# Patient Record
Sex: Female | Born: 1975 | Race: White | Hispanic: No | Marital: Married | Smoking: Current some day smoker
Health system: Southern US, Community
[De-identification: ages and names within clinical notes are randomized; demographics above are authoritative.]

## PROBLEM LIST (undated history)

## (undated) DIAGNOSIS — F32A Depression, unspecified: Secondary | ICD-10-CM

## (undated) DIAGNOSIS — M5430 Sciatica, unspecified side: Secondary | ICD-10-CM

## (undated) DIAGNOSIS — F329 Major depressive disorder, single episode, unspecified: Secondary | ICD-10-CM

## (undated) HISTORY — DX: Sciatica, unspecified side: M54.30

## (undated) HISTORY — DX: Depression, unspecified: F32.A

## (undated) HISTORY — PX: OTHER SURGICAL HISTORY: SHX169

---
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
## 2006-12-22 HISTORY — PX: LAPAROSCOPY: SHX197

## 2007-03-17 ENCOUNTER — Ambulatory Visit (HOSPITAL_COMMUNITY): Admission: RE | Admit: 2007-03-17 | Discharge: 2007-03-17 | Payer: Self-pay | Admitting: Obstetrics and Gynecology

## 2007-06-14 ENCOUNTER — Emergency Department: Payer: Self-pay | Admitting: Emergency Medicine

## 2007-07-02 ENCOUNTER — Emergency Department: Payer: Self-pay | Admitting: Emergency Medicine

## 2007-09-20 ENCOUNTER — Ambulatory Visit: Payer: Self-pay | Admitting: Pain Medicine

## 2007-09-27 ENCOUNTER — Encounter: Payer: Self-pay | Admitting: Unknown Physician Specialty

## 2007-09-28 ENCOUNTER — Ambulatory Visit: Payer: Self-pay | Admitting: Pain Medicine

## 2007-10-13 ENCOUNTER — Ambulatory Visit: Payer: Self-pay | Admitting: Pain Medicine

## 2007-10-23 ENCOUNTER — Encounter: Payer: Self-pay | Admitting: Unknown Physician Specialty

## 2007-11-22 ENCOUNTER — Encounter: Payer: Self-pay | Admitting: Unknown Physician Specialty

## 2008-06-05 ENCOUNTER — Ambulatory Visit: Payer: Self-pay | Admitting: Pain Medicine

## 2008-06-08 ENCOUNTER — Ambulatory Visit: Payer: Self-pay | Admitting: Pain Medicine

## 2008-06-28 ENCOUNTER — Ambulatory Visit: Payer: Self-pay | Admitting: Physician Assistant

## 2009-05-30 ENCOUNTER — Inpatient Hospital Stay: Payer: Self-pay | Admitting: Internal Medicine

## 2009-07-18 ENCOUNTER — Emergency Department: Payer: Self-pay | Admitting: Emergency Medicine

## 2009-11-09 ENCOUNTER — Emergency Department: Payer: Self-pay | Admitting: Emergency Medicine

## 2009-11-14 ENCOUNTER — Ambulatory Visit: Payer: Self-pay | Admitting: Gastroenterology

## 2011-12-08 ENCOUNTER — Ambulatory Visit (INDEPENDENT_AMBULATORY_CARE_PROVIDER_SITE_OTHER): Payer: BC Managed Care – PPO

## 2011-12-08 DIAGNOSIS — J209 Acute bronchitis, unspecified: Secondary | ICD-10-CM

## 2011-12-08 DIAGNOSIS — IMO0001 Reserved for inherently not codable concepts without codable children: Secondary | ICD-10-CM

## 2011-12-08 DIAGNOSIS — J9801 Acute bronchospasm: Secondary | ICD-10-CM

## 2012-01-20 ENCOUNTER — Ambulatory Visit (INDEPENDENT_AMBULATORY_CARE_PROVIDER_SITE_OTHER): Payer: BC Managed Care – PPO | Admitting: Family Medicine

## 2012-01-20 ENCOUNTER — Other Ambulatory Visit: Payer: Self-pay | Admitting: Family Medicine

## 2012-01-20 ENCOUNTER — Ambulatory Visit: Payer: BC Managed Care – PPO | Admitting: Physician Assistant

## 2012-01-20 DIAGNOSIS — R102 Pelvic and perineal pain: Secondary | ICD-10-CM

## 2012-01-20 DIAGNOSIS — R109 Unspecified abdominal pain: Secondary | ICD-10-CM

## 2012-01-20 DIAGNOSIS — Z113 Encounter for screening for infections with a predominantly sexual mode of transmission: Secondary | ICD-10-CM

## 2012-01-20 LAB — POCT CBC
Granulocyte percent: 66.7 %G (ref 37–80)
HCT, POC: 45.3 % (ref 37.7–47.9)
Hemoglobin: 14.3 g/dL (ref 12.2–16.2)
Lymph, poc: 4.2 — AB (ref 0.6–3.4)
MCH, POC: 32 pg — AB (ref 27–31.2)
MCHC: 31.6 g/dL — AB (ref 31.8–35.4)
MCV: 101.3 fL — AB (ref 80–97)
MID (cbc): 0.5 (ref 0–0.9)
MPV: 12.2 fL (ref 0–99.8)
POC Granulocyte: 9.3 — AB (ref 2–6.9)
POC LYMPH PERCENT: 29.7 % (ref 10–50)
POC MID %: 3.6 % (ref 0–12)
Platelet Count, POC: 187 10*3/uL (ref 142–424)
RBC: 4.47 M/uL (ref 4.04–5.48)
RDW, POC: 13.7 %
WBC: 14 10*3/uL — AB (ref 4.6–10.2)

## 2012-01-20 LAB — POCT UA - MICROSCOPIC ONLY
Casts, Ur, LPF, POC: NEGATIVE
Crystals, Ur, HPF, POC: NEGATIVE
Mucus, UA: NEGATIVE
WBC, Ur, HPF, POC: NEGATIVE
Yeast, UA: NEGATIVE

## 2012-01-20 LAB — POCT URINALYSIS DIPSTICK
Bilirubin, UA: NEGATIVE
Glucose, UA: NEGATIVE
Ketones, UA: NEGATIVE
Leukocytes, UA: NEGATIVE
Nitrite, UA: NEGATIVE
Protein, UA: NEGATIVE
Spec Grav, UA: 1.01
Urobilinogen, UA: 0.2
pH, UA: 6

## 2012-01-20 LAB — POCT URINE PREGNANCY: Preg Test, Ur: NEGATIVE

## 2012-01-20 MED ORDER — LEVONORGEST-ETH ESTRAD 91-DAY 0.1-0.02 & 0.01 MG PO TABS: 1.0000 | ORAL_TABLET | Freq: Every day | ORAL | Status: DC

## 2012-01-20 MED ORDER — TRAMADOL HCL 50 MG PO TABS: 50.0000 mg | ORAL_TABLET | Freq: Three times a day (TID) | ORAL | Status: DC | PRN

## 2012-01-20 MED ORDER — CIPROFLOXACIN HCL 500 MG PO TABS: 500.0000 mg | ORAL_TABLET | Freq: Two times a day (BID) | ORAL | Status: AC

## 2012-01-20 NOTE — Patient Instructions (Signed)

## 2012-01-20 NOTE — Progress Notes (Addendum)
Subjective:    Patient ID: Jessica Gordon, female    DOB: 1976-10-31, 36 y.o.   MRN: [id number]  Abdominal Pain This is a chronic problem. The current episode started more than 1 year ago. The onset quality is undetermined. The problem occurs constantly. The problem has been gradually worsening. The pain is located in the suprapubic region. The pain is severe. The quality of the pain is cramping and sharp. The abdominal pain does not radiate. Associated symptoms include flatus. Pertinent negatives include no anorexia, arthralgias, belching, constipation, diarrhea, dysuria, fever, frequency, headaches, hematochezia, hematuria, melena, myalgias, nausea, vomiting or weight loss. Exacerbated by: stress induced, worsens with stress. Relieved by: heating pad. She has tried acetaminophen for the symptoms. The treatment provided mild relief. There is no history of abdominal surgery, colon cancer, Crohn's disease, gallstones, GERD, irritable bowel syndrome, pancreatitis, PUD or ulcerative colitis.   LMP 3 weeks ago. No prior abnormal paps, last pap was > 1 year ago. no  Hx of STD, sexually active. [id number]. Stress related abd pain. Intensifies with stress. But no constipation or diarrhea. No history of narcotic abuse.    Review of Systems  Constitutional: Negative.  Negative for fever and weight loss.  Eyes: Negative for photophobia.  Respiratory: Negative for shortness of breath and wheezing.   Cardiovascular: Negative for chest pain and palpitations.  Gastrointestinal: Positive for abdominal pain and flatus. Negative for nausea, vomiting, diarrhea, constipation, melena, hematochezia, abdominal distention and anorexia.  Genitourinary: Positive for pelvic pain. Negative for dysuria, urgency, frequency, hematuria, flank pain, decreased urine volume, vaginal bleeding, vaginal discharge, difficulty urinating, vaginal pain, menstrual problem and dyspareunia.  Musculoskeletal: Negative for myalgias and  arthralgias.  Neurological: Negative for dizziness, weakness, numbness and headaches.  Hematological: Does not bruise/bleed easily.  Psychiatric/Behavioral: Negative.        Objective:   Physical Exam  Constitutional: She is oriented to person, place, and time. She appears well-developed and well-nourished.  HENT:  Head: Normocephalic.  Eyes: Pupils are equal, round, and reactive to light.  Neck: Normal range of motion. Neck supple.  Cardiovascular: Normal rate, regular rhythm and normal heart sounds.   Pulmonary/Chest: Effort normal and breath sounds normal.  Abdominal: Soft. Bowel sounds are normal. She exhibits no mass. There is tenderness. There is no rebound and no guarding.    Genitourinary: Vagina normal and uterus normal. No vaginal discharge found.  Musculoskeletal: Normal range of motion.  Neurological: She is alert and oriented to person, place, and time.  Skin: Skin is warm and dry.  Psychiatric: She has a normal mood and affect.  Gu exam: nabothian cysts x 2 at 6 pm but otherwise normal exam, no CMT, vag dc        Assessment & Plan:  1. (Chronic) Bilateral lower abdominal pain x 2 years-Check CBC, CMP, UA with micro, Abd Xray. Will rule out pathological etiology. CBC had a WBC of 14, although UA was negative will persumptively treat with Cipro 500 mg BID x 7 Days. STD testing pending. Other possible causes are appendicitis, ruptured ovarian cyst less likely diverticular dz, less likely UTI due to neg UA. May need GI referral vs Ob/Gyn referral pending [place] results. 2. STD Screening-HIV, RPR, HSV, G&C 3. Suprapubic/pelvic pain-Pap, Refer to get abdominal and pelvic ultrasound 4. Burth control-LoSeasonique ( pt has had it before), patient advised to stop smoking since OCP can cause increase risk of DVT/PE. Pt understands. Also advise her to not have sex until get results of labs and also  finish ABx, if she is going to have sex she needs to use a condom.   [facility] reading  (PRIMARY) by  Dr. Conley Rolls, diff gas patterns, normal.

## 2012-01-21 LAB — COMPREHENSIVE METABOLIC PANEL
ALT: 18 U/L (ref 0–35)
Albumin: 5 g/dL (ref 3.5–5.2)
Alkaline Phosphatase: 57 U/L (ref 39–117)
Glucose, Bld: 89 mg/dL (ref 70–99)
Potassium: 3.9 mEq/L (ref 3.5–5.3)
Sodium: 136 mEq/L (ref 135–145)
Total Bilirubin: 0.8 mg/dL (ref 0.3–1.2)
Total Protein: 7.7 g/dL (ref 6.0–8.3)

## 2012-01-21 LAB — COMPREHENSIVE METABOLIC PANEL WITH GFR
AST: 20 U/L (ref 0–37)
BUN: 12 mg/dL (ref 6–23)
CO2: 25 meq/L (ref 19–32)
Calcium: 9.5 mg/dL (ref 8.4–10.5)
Chloride: 102 meq/L (ref 96–112)
Creat: 0.72 mg/dL (ref 0.50–1.10)

## 2012-01-22 LAB — GC/CHLAMYDIA PROBE AMP, URINE
Chlamydia, Swab/Urine, PCR: NEGATIVE
GC Probe Amp, Urine: NEGATIVE

## 2012-01-22 LAB — HSV(HERPES SIMPLEX VRS) I + II AB-IGG
HSV 1 Glycoprotein G Ab, IgG: <0.1 IV
HSV 2 Glycoprotein G Ab, IgG: 7.75 IV — ABNORMAL HIGH

## 2012-01-22 LAB — RPR

## 2012-01-22 LAB — HIV ANTIBODY (ROUTINE TESTING W REFLEX): HIV: NONREACTIVE

## 2012-01-23 LAB — PAP IG, CT-NG, RFX HPV ASCU
Chlamydia Probe Amp: NEGATIVE
GC Probe Amp: NEGATIVE

## 2012-01-26 ENCOUNTER — Telehealth: Payer: Self-pay | Admitting: Family Medicine

## 2012-01-26 NOTE — Telephone Encounter (Signed)
Spoke with pt regarding labs. Told her to f/u with [place] and schedule for appt after [place]. If she does not hear from [place] then she needs to calll by MOnday. Still having abd pain after cipro.

## 2012-01-27 ENCOUNTER — Other Ambulatory Visit: Payer: Self-pay | Admitting: Family Medicine

## 2012-01-27 NOTE — Telephone Encounter (Signed)
Pt states she was on the phone with someone (can't remember who) about medication questions and a possible ultrasound referral. Pt's phone disconnected during that call. Pts phone also disconnected during our call.   Please call pt bf

## 2012-01-28 ENCOUNTER — Other Ambulatory Visit: Payer: Self-pay | Admitting: Family Medicine

## 2012-01-28 NOTE — Progress Notes (Signed)
Addended by: Lenell Antu on: 01/28/2012 10:55 PM   Modules accepted: Orders

## 2012-01-30 NOTE — Progress Notes (Signed)
Addended by: Lenell Antu on: 01/30/2012 09:22 AM   Modules accepted: Orders

## 2012-01-30 NOTE — Progress Notes (Signed)
Addended by: Lenell Antu on: 01/30/2012 09:38 AM   Modules accepted: Orders

## 2012-02-06 ENCOUNTER — Ambulatory Visit
Admission: RE | Admit: 2012-02-06 | Discharge: 2012-02-06 | Disposition: A | Discharge status: Home / Self Care | Payer: BC Managed Care – PPO | Source: Ambulatory Visit | Attending: Family Medicine | Admitting: Family Medicine

## 2012-02-06 ENCOUNTER — Telehealth: Payer: Self-pay

## 2012-02-06 DIAGNOSIS — R102 Pelvic and perineal pain: Secondary | ICD-10-CM

## 2012-02-06 DIAGNOSIS — Z113 Encounter for screening for infections with a predominantly sexual mode of transmission: Secondary | ICD-10-CM

## 2012-02-06 NOTE — Telephone Encounter (Signed)
Patient states that  The Tramadol that she was prescribed is not helping with the pain she is having pt would like to have something else called in. [facility] [address]

## 2012-02-07 ENCOUNTER — Other Ambulatory Visit: Payer: Self-pay | Admitting: Family Medicine

## 2012-02-07 ENCOUNTER — Telehealth: Payer: Self-pay | Admitting: Family Medicine

## 2012-02-07 DIAGNOSIS — B009 Herpesviral infection, unspecified: Secondary | ICD-10-CM

## 2012-02-07 DIAGNOSIS — G8929 Other chronic pain: Secondary | ICD-10-CM

## 2012-02-07 MED ORDER — TRAMADOL HCL 50 MG PO TABS: 50.0000 mg | ORAL_TABLET | Freq: Four times a day (QID) | ORAL | Status: DC | PRN

## 2012-02-07 MED ORDER — VALACYCLOVIR HCL 1 G PO TABS: ORAL_TABLET | ORAL | Status: DC

## 2012-02-07 NOTE — Telephone Encounter (Signed)
PTS BOYFRIEND JOEY CALLED FOR PT - STATES HAD U/S RECENTLY AND PT IS STILL IN 'EXCRUCIATING PAIN' - STATES PAIN MEDS NOT HELPING - HAVING A LOT OF CRAMPING. CAUTIONED PT TO SEE SOMEONE IF WORSENS.  BEST: [phone]

## 2012-02-07 NOTE — Telephone Encounter (Signed)
ALERT!   Called patient and asked her how she as doing and to go over labs and [place] results with her one more time. She states she is doing better but still having diffuse pelvic and lower abd pain. She also stated to me that she has lesions on her vagina and it hurts to wipe and this has been off and on for 1 year, current outbreak is recent, she did not have this on our office visit.  I will prescribe her Valtrex and ppx for Herpes as well.  She then proceeded to ask me what is wrong with her. I am not sure if this is functional bowel or some inflammatory process. Will refer her to GI. I do not think this is Ob/GYn related. 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   She then proceeded to ask me if she can have something like "hydrocodone" to help with her pain since she does not want to take the Tramadol so frequently. I told her I do not prescribe Hydrocodone or narcotics for abdominal pain without knowing the cause of her pain. Patient is agreeable to this over the phone, not sure if this may change later on.   Today will refill her tramadol and rx valtrex

## 2012-02-08 ENCOUNTER — Telehealth: Payer: Self-pay | Admitting: Family Medicine

## 2012-02-08 NOTE — Telephone Encounter (Signed)
Tramadol Refill: [facility] did not get my e-request for refill on 02/07/12. I called Tramadol in today 2/17/13and she is getting the medication refilled. Told her we will be in contact with her regarding GI referral. Again, I do not suspect appendicitis. Patient has ahd this chronic issue intermittently off and on and it increases with stress.

## 2012-02-09 ENCOUNTER — Other Ambulatory Visit: Payer: Self-pay | Admitting: Family Medicine

## 2012-02-22 ENCOUNTER — Other Ambulatory Visit: Payer: Self-pay | Admitting: Family Medicine

## 2012-02-23 ENCOUNTER — Encounter: Payer: Self-pay | Admitting: Gastroenterology

## 2012-03-08 ENCOUNTER — Other Ambulatory Visit: Payer: Self-pay | Admitting: Family Medicine

## 2012-03-10 ENCOUNTER — Encounter: Payer: Self-pay | Admitting: Gastroenterology

## 2012-03-10 ENCOUNTER — Ambulatory Visit (INDEPENDENT_AMBULATORY_CARE_PROVIDER_SITE_OTHER): Payer: BC Managed Care – PPO | Admitting: Gastroenterology

## 2012-03-10 VITALS — BP 90/64 | HR 72 | Ht 66.0 in | Wt 134.0 lb

## 2012-03-10 DIAGNOSIS — F411 Generalized anxiety disorder: Secondary | ICD-10-CM

## 2012-03-10 DIAGNOSIS — N949 Unspecified condition associated with female genital organs and menstrual cycle: Secondary | ICD-10-CM

## 2012-03-10 DIAGNOSIS — R102 Pelvic and perineal pain: Secondary | ICD-10-CM

## 2012-03-10 DIAGNOSIS — F419 Anxiety disorder, unspecified: Secondary | ICD-10-CM

## 2012-03-10 DIAGNOSIS — R109 Unspecified abdominal pain: Secondary | ICD-10-CM

## 2012-03-10 MED ORDER — GLYCOPYRROLATE 1 MG PO TABS: ORAL_TABLET | ORAL | Status: DC

## 2012-03-10 NOTE — Patient Instructions (Addendum)
You have been given a separate informational sheet regarding your tobacco use, the importance of quitting and local resources to help you quit.  You have been scheduled to see a new Primary Care Physician Dr. Sanda Linger on 04/14/12 at 10:30am on the 1st floor of the [facility] building. Make sure you bring your insurance cards and a list of medications. If you need to cancel or reschedule your appointment, please call [phone]. You have also been scheduled to see Dr. Ambrose Mantle at [facility] [address] on 03/31/12 at 1:00pm. Also make sure your bring your insurance cards and co-pay. If you need to cancel or reschedule your appointment, please call [phone]. We have sent the following medications to your pharmacy for you to pick up at your convenience:Robinul.   cc: Tracey Harries, MD       Onnie Boer, MD

## 2012-03-10 NOTE — Progress Notes (Addendum)
History of Present Illness: This is a 36 year old female with a 5 to 6 year history of recurrent lower abdominal and pelvic pain. She states her symptoms are clearly worsened under times of stress. She is currently going through divorce and her symptoms have been worse over the past 6 months. She relates that she had an extensive evaluation by Dr. Annett Fabian, a gynecologist in [place], within the past 2-3 years including abdominal and pelvic imaging studies and a laparoscopy. Recently she has been seeing Dr. Onnie Boer at an urgent care center on [address].  She underwent colonoscopy and an upper endoscopy by Dr. Skeet Simmer in [place] in December 2010. Colonoscopy with random biopsies was normal except for internal hemorrhoids. The upper endoscopy showed only a mild reactive gastropathy in the antrum. She was previously seen by Dr. Servando Snare in [place] and was diagnosed with abdominal wall pain which was treated with Lidoderm patches and warm compresses. She cannot relate any definite gyn diagnosis that has been established except possibly for an inverted uterus. She relates a 10 pound weight gain in the past few months. Denies weight loss, constipation, diarrhea, change in stool caliber, melena, hematochezia, nausea, vomiting, dysphagia, reflux symptoms, chest pain.  Current Medications, Allergies, Past Medical History, Past Surgical History, Family History and Social History were reviewed in [employer] record.  Physical Exam: General: Well developed , well nourished, tearful, no acute distress Head: Normocephalic and atraumatic Eyes:  sclerae anicteric, EOMI Ears: Normal auditory acuity Mouth: No deformity or lesions Lungs: Clear throughout to auscultation Heart: Regular rate and rhythm; no murmurs, rubs or bruits Abdomen: Soft, mild lower, we'll tenderness to deep palpation without rebound or guarding and non distended. No masses, hepatosplenomegaly or hernias noted.  Normal Bowel sounds Rectal: Deferred Musculoskeletal: Symmetrical with no gross deformities  Pulses:  Normal pulses noted Extremities: No clubbing, cyanosis, edema or deformities noted Neurological: Alert oriented x 4, grossly nonfocal Psychological:  Alert and cooperative. Tearful, forgetful, anxious.  Assessment and Recommendations:  1. Lower abdominal and pelvic pain. Her symptoms are exacerbated by stress. Possible abdominal wall pain, functional pain or gynecologic pain. She has no other associated gastrointestinal symptoms. I'm awaiting the result of prior imaging studies however they are reportedly negative. Colonoscopy and upper endoscopy were essentially negative. Trial of Robinul bid however her symptoms do not fit a diagnosis of irritable bowel syndrome as she has no bowel habit variation. We will not be recommending or supplying pain medications given that it appears she does not have an underlying GI diagnosis.  2. Anxiety appears to be significantly contributing to her symptoms.

## 2012-03-12 ENCOUNTER — Telehealth: Payer: Self-pay

## 2012-03-12 ENCOUNTER — Other Ambulatory Visit: Payer: Self-pay | Admitting: Family Medicine

## 2012-03-12 DIAGNOSIS — G8929 Other chronic pain: Secondary | ICD-10-CM

## 2012-03-12 MED ORDER — TRAMADOL HCL 50 MG PO TABS: 50.0000 mg | ORAL_TABLET | Freq: Four times a day (QID) | ORAL | Status: DC | PRN

## 2012-03-12 NOTE — Telephone Encounter (Signed)
Pt is calling because pharmacy hasn't received RF req back from [place] from a week ago, and pt wanted to check status. I don't see a request. Pt is requesting RF of Tramadol from Dr Conley Rolls. She went to her GI appt on Wed but they think it is more of a Gyn problem. GI has referred her to a PCP (appt 03/31/12) and Gyn (04/14/12), but pt wondered what to do about pain until then. Does Dr Conley Rolls need to see her again, or can she RF to [employer]  until she gets into see the other MDs?

## 2012-03-12 NOTE — Telephone Encounter (Signed)
Sent tramadol rx into pharmacy on file

## 2012-03-15 ENCOUNTER — Other Ambulatory Visit: Payer: Self-pay | Admitting: Family Medicine

## 2012-03-31 ENCOUNTER — Other Ambulatory Visit: Payer: Self-pay | Admitting: Family Medicine

## 2012-04-14 ENCOUNTER — Other Ambulatory Visit (INDEPENDENT_AMBULATORY_CARE_PROVIDER_SITE_OTHER): Payer: BC Managed Care – PPO

## 2012-04-14 ENCOUNTER — Encounter: Payer: Self-pay | Admitting: Internal Medicine

## 2012-04-14 ENCOUNTER — Other Ambulatory Visit: Payer: Self-pay | Admitting: Internal Medicine

## 2012-04-14 ENCOUNTER — Ambulatory Visit (INDEPENDENT_AMBULATORY_CARE_PROVIDER_SITE_OTHER): Payer: BC Managed Care – PPO | Admitting: Internal Medicine

## 2012-04-14 DIAGNOSIS — R10819 Abdominal tenderness, unspecified site: Secondary | ICD-10-CM

## 2012-04-14 DIAGNOSIS — Z23 Encounter for immunization: Secondary | ICD-10-CM

## 2012-04-14 LAB — CBC WITH DIFFERENTIAL/PLATELET
Basophils Relative: 0.2 % (ref 0.0–3.0)
Eosinophils Absolute: 0 10*3/uL (ref 0.0–0.7)
Lymphs Abs: 0.7 10*3/uL (ref 0.7–4.0)
MCHC: 33.2 g/dL (ref 30.0–36.0)
MCV: 102.8 fl — ABNORMAL HIGH (ref 78.0–100.0)
Monocytes Absolute: 0.6 10*3/uL (ref 0.1–1.0)
Neutrophils Relative %: 84.3 % — ABNORMAL HIGH (ref 43.0–77.0)
Platelets: 146 10*3/uL — ABNORMAL LOW (ref 150.0–400.0)
RBC: 3.8 Mil/uL — ABNORMAL LOW (ref 3.87–5.11)

## 2012-04-14 LAB — COMPREHENSIVE METABOLIC PANEL
AST: 15 U/L (ref 0–37)
Albumin: 3.9 g/dL (ref 3.5–5.2)
Alkaline Phosphatase: 43 U/L (ref 39–117)
Potassium: 4.1 mEq/L (ref 3.5–5.1)
Sodium: 140 mEq/L (ref 135–145)
Total Protein: 7.2 g/dL (ref 6.0–8.3)

## 2012-04-14 LAB — URINALYSIS, ROUTINE W REFLEX MICROSCOPIC
Bilirubin Urine: NEGATIVE
Ketones, ur: NEGATIVE
Nitrite: NEGATIVE
Total Protein, Urine: NEGATIVE

## 2012-04-14 LAB — LIPASE: Lipase: 35 U/L (ref 11.0–59.0)

## 2012-04-14 LAB — SEDIMENTATION RATE: Sed Rate: 6 mm/hr (ref 0–22)

## 2012-04-14 NOTE — Assessment & Plan Note (Signed)
She tells me that she will have her prior records sent from ?MD in [place] and from [facility], I will check her labs today to look for organic illness or substance abuse, I gave her pt ed material about abd pain and IBS, I did not prescribe anything today for her symptoms

## 2012-04-14 NOTE — Patient Instructions (Signed)
Irritable Bowel Syndrome Irritable Bowel Syndrome (IBS) is caused by a disturbance of normal bowel function. Other terms used are spastic colon, mucous colitis, and irritable colon. It does not require surgery, nor does it lead to cancer. There is no cure for IBS. But with proper diet, stress reduction, and medication, you will find that your problems (symptoms) will gradually disappear or improve. IBS is a common digestive disorder. It usually appears in late adolescence or early adulthood. Women develop it twice as often as men. CAUSES  After food has been digested and absorbed in the small intestine, waste material is moved into the colon (large intestine). In the colon, water and salts are absorbed from the undigested products coming from the small intestine. The remaining residue, or fecal material, is held for elimination. Under normal circumstances, gentle, rhythmic contractions on the bowel walls push the fecal material along the colon towards the rectum. In IBS, however, these contractions are irregular and poorly coordinated. The fecal material is either retained too long, resulting in constipation, or expelled too soon, producing diarrhea. SYMPTOMS  The most common symptom of IBS is pain. It is typically in the lower left side of the belly (abdomen). But it may occur anywhere in the abdomen. It can be felt as heartburn, backache, or even as a dull pain in the arms or shoulders. The pain comes from excessive bowel-muscle spasms and from the buildup of gas and fecal material in the colon. This pain:  Can range from sharp belly (abdominal) cramps to a dull, continuous ache.   Usually worsens soon after eating.   Is typically relieved by having a bowel movement or passing gas.  Abdominal pain is usually accompanied by constipation. But it may also produce diarrhea. The diarrhea typically occurs right after a meal or upon arising in the morning. The stools are typically soft and watery. They are  often flecked with secretions (mucus). Other symptoms of IBS include:  Bloating.   Loss of appetite.   Heartburn.   Feeling sick to your stomach (nausea).   Belching   Vomiting   Gas.  IBS may also cause a number of symptoms that are unrelated to the digestive system:  Fatigue.   Headaches.   Anxiety   Shortness of breath   Difficulty in concentrating.   Dizziness.  These symptoms tend to come and go. DIAGNOSIS  The symptoms of IBS closely mimic the symptoms of other, more serious digestive disorders. So your caregiver may wish to perform a variety of additional tests to exclude these disorders. He/she wants to be certain of learning what is wrong (diagnosis). The nature and purpose of each test will be explained to you. TREATMENT A number of medications are available to help correct bowel function and/or relieve bowel spasms and abdominal pain. Among the drugs available are:  Mild, non-irritating laxatives for severe constipation and to help restore normal bowel habits.   Specific anti-diarrheal medications to treat severe or prolonged diarrhea.   Anti-spasmodic agents to relieve intestinal cramps.   Your caregiver may also decide to treat you with a mild tranquilizer or sedative during unusually stressful periods in your life.  The important thing to remember is that if any drug is prescribed for you, make sure that you take it exactly as directed. Make sure that your caregiver knows how well it worked for you. HOME CARE INSTRUCTIONS   Avoid foods that are high in fat or oils. Some examples are:heavy cream, butter, frankfurters, sausage, and other fatty   meats.   Avoid foods that have a laxative effect, such as fruit, fruit juice, and dairy products.   Cut out carbonated drinks, chewing gum, and "gassy" foods, such as beans and cabbage. This may help relieve bloating and belching.   Bran taken with plenty of liquids may help relieve constipation.   Keep track of  what foods seem to trigger your symptoms.   Avoid emotionally charged situations or circumstances that produce anxiety.   Start or continue exercising.   Get plenty of rest and sleep.  MAKE SURE YOU:   Understand these instructions.   Will watch your condition.   Will get help right away if you are not doing well or get worse.  Document Released: 12/08/2005 Document Revised: 11/27/2011 Document Reviewed: 07/28/2008 [facility] Patient Information 2012 [place], [place].Abdominal Pain Abdominal pain can be caused by many things. Your caregiver decides the seriousness of your pain by an examination and possibly blood tests and X-rays. Many cases can be observed and treated at home. Most abdominal pain is not caused by a disease and will probably improve without treatment. However, in many cases, more time must pass before a clear cause of the pain can be found. Before that point, it may not be known if you need more testing, or if hospitalization or surgery is needed. HOME CARE INSTRUCTIONS   Do not take laxatives unless directed by your caregiver.   Take pain medicine only as directed by your caregiver.   Only take over-the-counter or prescription medicines for pain, discomfort, or fever as directed by your caregiver.   Try a clear liquid diet (broth, tea, or water) for as long as directed by your caregiver. Slowly move to a bland diet as tolerated.  SEEK IMMEDIATE MEDICAL CARE IF:   The pain does not go away.   You have a fever.   You keep throwing up (vomiting).   The pain is felt only in portions of the abdomen. Pain in the right side could possibly be appendicitis. In an adult, pain in the left lower portion of the abdomen could be colitis or diverticulitis.   You pass bloody or black tarry stools.  MAKE SURE YOU:   Understand these instructions.   Will watch your condition.   Will get help right away if you are not doing well or get worse.  Document Released: 09/17/2005  Document Revised: 11/27/2011 Document Reviewed: 07/26/2008 [facility] Patient Information 2012 [place], [place].

## 2012-04-14 NOTE — Progress Notes (Signed)
Subjective:    Patient ID: Jessica Gordon, female    DOB: 02/22/1976, 36 y.o.   MRN: [id number]  Abdominal Pain This is a chronic problem. The current episode started more than 1 year ago. The onset quality is gradual. The problem occurs intermittently. The most recent episode lasted 6 months. The problem has been unchanged. The pain is located in the generalized abdominal region. The pain is at a severity of 1/10. The pain is mild. The quality of the pain is sharp, a sensation of fullness and cramping. The abdominal pain does not radiate. Pertinent negatives include no anorexia, arthralgias, belching, constipation, diarrhea, dysuria, fever, flatus, frequency, headaches, hematochezia, hematuria, melena, myalgias, nausea, vomiting or weight loss. The pain is aggravated by nothing. The pain is relieved by nothing. Treatments tried: tramadol and robinul. The treatment provided no relief. Prior diagnostic workup includes upper endoscopy (EGD and other studies done at [facility], she does not know that MD name and she has no records today). There is no history of abdominal surgery, colon cancer, Crohn's disease, gallstones, GERD, irritable bowel syndrome, pancreatitis, PUD or ulcerative colitis.      Review of Systems  Constitutional: Negative for fever, chills, weight loss, diaphoresis, activity change, appetite change, fatigue and unexpected weight change.  HENT: Negative.   Eyes: Negative.   Respiratory: Negative for apnea, cough, choking, chest tightness, shortness of breath, wheezing and stridor.   Cardiovascular: Negative for chest pain, palpitations and leg swelling.  Gastrointestinal: Positive for abdominal pain. Negative for nausea, vomiting, diarrhea, constipation, blood in stool, melena, hematochezia, abdominal distention, anal bleeding, rectal pain, anorexia and flatus.  Genitourinary: Negative for dysuria, urgency, frequency, hematuria, flank pain, decreased urine volume, vaginal bleeding, vaginal  discharge, enuresis, difficulty urinating, genital sores, vaginal pain, menstrual problem, pelvic pain and dyspareunia.  Musculoskeletal: Negative for myalgias, back pain, joint swelling, arthralgias and gait problem.  Skin: Negative for color change, pallor, rash and wound.  Neurological: Negative.  Negative for headaches.  Hematological: Negative for adenopathy. Does not bruise/bleed easily.  Psychiatric/Behavioral: Negative for suicidal ideas, hallucinations, behavioral problems, confusion, self-injury, dysphoric mood, decreased concentration and agitation. The patient is not nervous/anxious and is not hyperactive.        Objective:   Physical Exam  Vitals reviewed. Constitutional: She is oriented to person, place, and time. She appears well-developed and well-nourished. No distress.  HENT:  Head: Normocephalic and atraumatic.  Mouth/Throat: Oropharynx is clear and moist. No oropharyngeal exudate.  Eyes: Conjunctivae are normal. Right eye exhibits no discharge. Left eye exhibits no discharge. No scleral icterus.  Neck: Normal range of motion. Neck supple. No JVD present. No tracheal deviation present. No thyromegaly present.  Cardiovascular: Normal rate, regular rhythm, normal heart sounds and intact distal pulses.  Exam reveals no gallop and no friction rub.   No murmur heard. Pulmonary/Chest: Effort normal and breath sounds normal. No stridor. No respiratory distress. She has no wheezes. She has no rales. She exhibits no tenderness.  Abdominal: Soft. Bowel sounds are normal. She exhibits no distension and no mass. There is no tenderness. There is no rebound and no guarding.  Musculoskeletal: Normal range of motion. She exhibits no edema and no tenderness.  Lymphadenopathy:    She has no cervical adenopathy.  Neurological: She is oriented to person, place, and time.  Skin: Skin is warm, dry and intact. No abrasion and no rash noted. She is not diaphoretic. No erythema. No pallor.        She has deep healthy tan  Psychiatric: She has a normal mood and affect. Her behavior is normal. Judgment and thought content normal.          Assessment & Plan:

## 2012-04-15 LAB — DRUGS OF ABUSE SCREEN W/O ALC, ROUTINE URINE
Amphetamine Screen, Ur: NEGATIVE
Benzodiazepines.: NEGATIVE
Cocaine Metabolites: NEGATIVE
Marijuana Metabolite: NEGATIVE
Phencyclidine (PCP): NEGATIVE

## 2012-04-17 LAB — OPIATE, QUANTITATIVE, URINE
Codeine Urine: NEGATIVE NG/ML
Hydrocodone: 3179 NG/ML — ABNORMAL HIGH
Hydromorphone - Total: 510 NG/ML — ABNORMAL HIGH
Morphine Urine: NEGATIVE NG/ML

## 2012-07-28 ENCOUNTER — Other Ambulatory Visit: Payer: Self-pay | Admitting: Anesthesiology

## 2012-07-28 DIAGNOSIS — M545 Low back pain: Secondary | ICD-10-CM

## 2012-07-28 DIAGNOSIS — M629 Disorder of muscle, unspecified: Secondary | ICD-10-CM

## 2012-07-28 DIAGNOSIS — S335XXA Sprain of ligaments of lumbar spine, initial encounter: Secondary | ICD-10-CM

## 2012-08-26 NOTE — Progress Notes (Signed)
This encounter was created in error - please disregard.  This encounter was created in error - please disregard.

## 2012-10-11 ENCOUNTER — Other Ambulatory Visit: Payer: Self-pay | Admitting: Family Medicine

## 2012-10-12 ENCOUNTER — Other Ambulatory Visit: Payer: BC Managed Care – PPO

## 2012-11-22 ENCOUNTER — Other Ambulatory Visit: Payer: Self-pay | Admitting: Physician Assistant

## 2013-04-08 ENCOUNTER — Other Ambulatory Visit: Payer: Self-pay | Admitting: Physician Assistant

## 2013-04-19 ENCOUNTER — Other Ambulatory Visit: Payer: Self-pay | Admitting: Physician Assistant

## 2013-04-19 ENCOUNTER — Other Ambulatory Visit: Payer: Self-pay | Admitting: Family Medicine

## 2013-07-11 ENCOUNTER — Other Ambulatory Visit: Payer: Self-pay | Admitting: Family Medicine

## 2016-10-15 ENCOUNTER — Encounter (HOSPITAL_COMMUNITY): Payer: Self-pay | Admitting: Emergency Medicine

## 2016-10-15 DIAGNOSIS — F302 Manic episode, severe with psychotic symptoms: Secondary | ICD-10-CM | POA: Insufficient documentation

## 2016-10-15 DIAGNOSIS — F17210 Nicotine dependence, cigarettes, uncomplicated: Secondary | ICD-10-CM | POA: Insufficient documentation

## 2016-10-15 LAB — CBC
HEMATOCRIT: 42.5 % (ref 36.0–46.0)
Hemoglobin: 14.1 g/dL (ref 12.0–15.0)
MCH: 33.5 pg (ref 26.0–34.0)
MCHC: 33.2 g/dL (ref 30.0–36.0)
MCV: 101 fL — AB (ref 78.0–100.0)
PLATELETS: 203 10*3/uL (ref 150–400)
RBC: 4.21 MIL/uL (ref 3.87–5.11)
RDW: 14 % (ref 11.5–15.5)
WBC: 11.3 10*3/uL — AB (ref 4.0–10.5)

## 2016-10-15 LAB — COMPREHENSIVE METABOLIC PANEL
ALK PHOS: 57 U/L (ref 38–126)
ALT: 18 U/L (ref 14–54)
AST: 23 U/L (ref 15–41)
Albumin: 4.6 g/dL (ref 3.5–5.0)
Anion gap: 8 (ref 5–15)
BILIRUBIN TOTAL: 0.5 mg/dL (ref 0.3–1.2)
BUN: 7 mg/dL (ref 6–20)
CALCIUM: 9.7 mg/dL (ref 8.9–10.3)
CHLORIDE: 103 mmol/L (ref 101–111)
CO2: 28 mmol/L (ref 22–32)
CREATININE: 0.81 mg/dL (ref 0.44–1.00)
Glucose, Bld: 84 mg/dL (ref 65–99)
Potassium: 3.5 mmol/L (ref 3.5–5.1)
Sodium: 139 mmol/L (ref 135–145)
Total Protein: 7.2 g/dL (ref 6.5–8.1)

## 2016-10-15 LAB — ETHANOL

## 2016-10-15 LAB — RAPID URINE DRUG SCREEN, HOSP PERFORMED
Amphetamines: NOT DETECTED
BARBITURATES: NOT DETECTED
Benzodiazepines: NOT DETECTED
COCAINE: NOT DETECTED
Opiates: NOT DETECTED
Tetrahydrocannabinol: NOT DETECTED

## 2016-10-15 NOTE — ED Triage Notes (Signed)
[facility] complaining of all over body pain, and is having tactile hallucinations. Pt states she is seeing metal falling out of her skin and things "shooting" out of her hands. She has multiple "picking" wounds on her hands, arms, head, and pubic area. Denies SI/HI/AVH.

## 2016-10-16 ENCOUNTER — Emergency Department (HOSPITAL_COMMUNITY)
Admission: EM | Admit: 2016-10-16 | Discharge: 2016-10-16 | Disposition: A | Discharge status: Home / Self Care | Payer: Self-pay | Attending: Emergency Medicine | Admitting: Emergency Medicine

## 2016-10-16 DIAGNOSIS — F419 Anxiety disorder, unspecified: Secondary | ICD-10-CM

## 2016-10-16 DIAGNOSIS — Z8042 Family history of malignant neoplasm of prostate: Secondary | ICD-10-CM

## 2016-10-16 DIAGNOSIS — F17210 Nicotine dependence, cigarettes, uncomplicated: Secondary | ICD-10-CM

## 2016-10-16 DIAGNOSIS — Z79899 Other long term (current) drug therapy: Secondary | ICD-10-CM

## 2016-10-16 DIAGNOSIS — F4322 Adjustment disorder with anxiety: Secondary | ICD-10-CM

## 2016-10-16 LAB — PREGNANCY, URINE: PREG TEST UR: NEGATIVE

## 2016-10-16 MED ORDER — ACETAMINOPHEN 325 MG PO TABS
650.0000 mg | ORAL_TABLET | ORAL | Status: DC | PRN
  Administered 2016-10-16 (×2): 650 mg via ORAL
  Filled 2016-10-16 (×2): qty 2

## 2016-10-16 MED ORDER — NICOTINE 14 MG/24HR TD PT24
14.0000 mg | MEDICATED_PATCH | Freq: Every day | TRANSDERMAL | Status: DC
  Administered 2016-10-16 (×2): 14 mg via TRANSDERMAL
  Filled 2016-10-16 (×2): qty 1

## 2016-10-16 MED ORDER — LORAZEPAM 1 MG PO TABS
1.0000 mg | ORAL_TABLET | Freq: Three times a day (TID) | ORAL | Status: DC | PRN
  Administered 2016-10-16: 1 mg via ORAL
  Filled 2016-10-16: qty 1

## 2016-10-16 NOTE — BH Assessment (Addendum)
TTS interview is complete. Clinician consulted with Donell SievertSpencer Simon, PA and pt does not meet criteria for inpatient admission. Pt is recommended to follow up with outpatient resources to be provided by TTS. Clinician informed Windell Mouldinguth, RN of pt disposition. Clinician discussed pt disposition with EDP Dr.Wickline who states that although pt, denies risk of harm, he feels pt would benefit from receiving a psychiatric evaluation in the AM.    Final disposition- AM Psych Eval.

## 2016-10-16 NOTE — ED Notes (Signed)
TTS in progress 
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
## 2016-10-16 NOTE — ED Notes (Signed)
Per Psychiatrist, patient to be discharged to outpatient care.  Patient notified, has called her mother to notify of disposition.

## 2016-10-16 NOTE — ED Provider Notes (Signed)
[facility]-EMERGENCY DEPT Provider Note   CSN: [id number] Arrival date & time: 10/15/16  2124  By signing my name below, I, Jessica Gordon, attest that this documentation has been prepared under the direction and in the presence of Jessica Rhineonald Seaver Machia, MD. Electronically Signed: [name], ED Scribe. 10/16/16. 1:57 AM.   History   Chief Complaint Chief Complaint  Patient presents with  . Psychiatric Evaluation   LEVEL 5 CAVEAT: HPI and ROS limited due to Psychiatric  Pt presents to ED for multiple skin lesions with varying degrees of healing throughout her entire body. She states they feel similar to razors or splinters. She reports associated weight loss and minimal vomiting. Pt states this is a flare up due to stress of prior symptoms that began 4-5 years ago. Denies drug use.   The history is provided by the patient. No language interpreter was used.  Mental Health Problem  Presenting symptoms: agitation, delusional, disorganized thought process and hallucinations   Presenting symptoms: no suicidal thoughts   Degree of incapacity (severity):  Severe Onset quality:  Gradual Duration: Unknown. Timing:  Constant Progression:  Worsening Chronicity:  Chronic Exacerbated by: stress. Associated symptoms: weight change     PMH - unknown Patient Active Problem List   Diagnosis Date Noted  . Abdominal tenderness 04/14/2012    Past Surgical History:  Procedure Laterality Date  . LAPAROSCOPY  2008   Exploratory  . left forearm fracture s/p pinning      OB History    No data available       Home Medications    Prior to Admission medications   Medication Sig Start Date End Date Taking? Authorizing Provider  glycopyrrolate (ROBINUL) 1 MG tablet 1-2 tablets by mouth twice daily 03/10/12   Meryl DareMalcolm T Stark, MD  Levonorgestrel-Ethinyl Estradiol (AMETHIA LO) 0.1-0.02 & 0.01 MG tablet Take 1 tablet by mouth daily. Needs office visit for further refills. Final notice. 07/11/13   Ryan M  Dunn, PA-C  valACYclovir (VALTREX) 1000 MG tablet TAKE 1 TABLET BY MOUTH TWICE DAILY X 7 DAYS ONLY , THEN MAY TAKE 1 TABLET EVERY DAY FOR PREVENTION 04/19/13   Thao P Le, DO    Family History Family History  Problem Relation Age of Onset  . Prostate cancer Paternal Grandfather   . Cancer Other     Prostate Cancer- Grandparent  . Alcohol abuse Neg Hx   . Asthma Neg Hx   . Drug abuse Neg Hx   . Diabetes Neg Hx   . Heart disease Neg Hx   . Hyperlipidemia Neg Hx   . Stroke Neg Hx     Social History Social History  Substance Use Topics  . Smoking status: Current Some Day Smoker    Packs/day: 0.50    Years: 5.00    Types: Cigarettes  . Smokeless tobacco: Never Used     Comment: ESTIMATED 1 PACK PER WEEK  . Alcohol use Not on file     Allergies   Review of patient's allergies indicates no known allergies.   Review of Systems Review of Systems  Unable to perform ROS: Psychiatric disorder  Constitutional: Positive for unexpected weight change. Negative for fever.  Gastrointestinal: Positive for vomiting.  Psychiatric/Behavioral: Positive for agitation and hallucinations. Negative for suicidal ideas.   Physical Exam Updated Vital Signs BP 98/65 (BP Location: Left Arm)   Pulse 90   Temp 98.8 F (37.1 C) (Oral)   Resp 20   SpO2 98%   Physical Exam  Nursing note  and vitals reviewed.  CONSTITUTIONAL: Disheveled and anxious HEAD: Normocephalic/atraumatic EYES: EOMI/PERRL ENMT: Mucous membranes moist NECK: supple no meningeal signs CV: S1/S2 noted, no murmurs/rubs/gallops noted LUNGS: Lungs are clear to auscultation bilaterally, no apparent distress ABDOMEN: soft, nontender GU:no cva tenderness NEURO: Pt is awake/alert, moves all extremitiesx4.  No facial droop.   EXTREMITIES: pulses normal/equal, full ROM SKIN: warm, color normal, multiple scabs throughout body with various stages of healing, no abscesses or cellulitis noted PSYCH: anxious and agitated, pt with  flight of ideas throughout our conversation  ED Treatments / Results  DIAGNOSTIC STUDIES: Oxygen Saturation is 98% on RA, normal by my interpretation.    COORDINATION OF CARE: 1:46 AM Discussed treatment plan with pt at bedside which includes consult with TTS and pt agreed to plan.  Labs (all labs ordered are listed, but only abnormal results are displayed) Labs Reviewed  CBC - Abnormal; Notable for the following:       Result Value   WBC 11.3 (*)    MCV 101.0 (*)    All other components within normal limits  COMPREHENSIVE METABOLIC PANEL  ETHANOL  RAPID URINE DRUG SCREEN, HOSP PERFORMED  PREGNANCY, URINE    EKG  EKG Interpretation None       Radiology No results found.  Procedures Procedures (including critical care time)  Medications Ordered in ED Medications  LORazepam (ATIVAN) tablet 1 mg (1 mg Oral Given 10/16/16 0216)  acetaminophen (TYLENOL) tablet 650 mg (650 mg Oral Given 10/16/16 0216)  nicotine (NICODERM CQ - dosed in mg/24 hours) patch 14 mg (14 mg Transdermal Patch Applied 10/16/16 0230)     Initial Impression / Assessment and Plan / ED Course  I have reviewed the triage vital signs and the nursing notes.  Pertinent labs & imaging results that were available during my care of the patient were reviewed by me and considered in my medical decision making (see chart for details).  Clinical Course   Pt here with full body pain, reports she has had these sores on her body for years - they do not appear actively infected, no abscess and no cellulitis.   She reports hallucinations of things falling out of her skin She also appears manic - she has had lack of sleep, flight of ideas.  It is unclear if she has previous psych history She is otherwise medically stable She will have AM psych evaluation  Final Clinical Impressions(s) / ED Diagnoses   Final diagnoses:  Mania (HCC)   I personally performed the services described in this documentation, which  was scribed in my presence. The recorded information has been reviewed and is accurate.    New Prescriptions New Prescriptions   No medications on file     Jessica Rhine, MD 10/16/16 [phone]

## 2016-10-16 NOTE — ED Notes (Signed)
Psychiatrist at bedside

## 2016-10-16 NOTE — ED Notes (Signed)
Patient was given a snack and drink, and a regular diet ordered for lunch. 

## 2016-10-16 NOTE — ED Provider Notes (Signed)
Patient calm and alert, and appears in no acute distress.  [facility] and Dr Shela CommonsJ, have reassessed - he indicates pt stable for d/c.   [facility] and give [facility] resource guide.    Cathren LaineKevin Virgil Lightner, MD 10/16/16 [phone]
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
## 2016-10-16 NOTE — Discharge Instructions (Signed)
It was our pleasure to provide your ER care today - we hope that you feel better.  For mental health issues and/or crisis, [facility]  Also, follow up with primary care doctor in the next few days.  Return to ER if worse, new symptoms, fevers, or other medical emergency.

## 2017-08-14 ENCOUNTER — Ambulatory Visit: Payer: Self-pay | Admitting: Primary Care

## 2017-08-23 ENCOUNTER — Ambulatory Visit (HOSPITAL_COMMUNITY)
Admission: EM | Admit: 2017-08-23 | Discharge: 2017-08-23 | Disposition: A | Discharge status: Home / Self Care | Payer: Self-pay | Attending: Family Medicine | Admitting: Family Medicine

## 2017-08-23 ENCOUNTER — Encounter (HOSPITAL_COMMUNITY): Payer: Self-pay | Admitting: Emergency Medicine

## 2017-08-23 DIAGNOSIS — G8929 Other chronic pain: Secondary | ICD-10-CM

## 2017-08-23 DIAGNOSIS — R21 Rash and other nonspecific skin eruption: Secondary | ICD-10-CM

## 2017-08-23 DIAGNOSIS — M545 Low back pain: Secondary | ICD-10-CM

## 2017-08-23 DIAGNOSIS — R1084 Generalized abdominal pain: Secondary | ICD-10-CM

## 2017-08-23 MED ORDER — CYCLOBENZAPRINE HCL 5 MG PO TABS: 5.0000 mg | ORAL_TABLET | Freq: Three times a day (TID) | ORAL | 0 refills | Status: DC | PRN

## 2017-08-23 MED ORDER — NAPROXEN 500 MG PO TBEC: 500.0000 mg | DELAYED_RELEASE_TABLET | Freq: Two times a day (BID) | ORAL | 0 refills | Status: DC
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
## 2017-08-23 NOTE — Discharge Instructions (Signed)
Remember when you set up with Dr. Dayton Martes, to have the most thorough care, delegate one problem per visit.  EXERCISES  RANGE OF MOTION (ROM) AND STRETCHING EXERCISES - Low Back Sprain Most people with lower back pain will find that their symptoms get worse with excessive bending forward (flexion) or arching at the lower back (extension). The exercises that will help resolve your symptoms will focus on the opposite motion.  Your physician, physical therapist or athletic trainer will help you determine which exercises will be most helpful to resolve your lower back pain. Do not complete any exercises without first consulting with your caregiver. Discontinue any exercises which make your symptoms worse, until you speak to your caregiver. If you have pain, numbness or tingling which travels down into your buttocks, leg or foot, the goal of the therapy is for these symptoms to move closer to your back and eventually resolve. Sometimes, these leg symptoms will get better, but your lower back pain may worsen. This is often an indication of progress in your rehabilitation. Be very alert to any changes in your symptoms and the activities in which you participated in the 24 hours prior to the change. Sharing this information with your caregiver will allow him or her to most efficiently treat your condition. These exercises may help you when beginning to rehabilitate your injury. Your symptoms may resolve with or without further involvement from your physician, physical therapist or athletic trainer. While completing these exercises, remember:  Restoring tissue flexibility helps normal motion to return to the joints. This allows healthier, less painful movement and activity. An effective stretch should be held for at least 30 seconds. A stretch should never be painful. You should only feel a gentle lengthening or release in the stretched tissue. FLEXION RANGE OF MOTION AND STRETCHING EXERCISES:  STRETCH - Flexion,  Single Knee to Chest  Lie on a firm bed or floor with both legs extended in front of you. Keeping one leg in contact with the floor, bring your opposite knee to your chest. Hold your leg in place by either grabbing behind your thigh or at your knee. Pull until you feel a gentle stretch in your low back. Hold 15-20 seconds. Slowly release your grasp and repeat the exercise with the opposite side. Repeat 2 times. Complete this exercise 1-2 times per day.   STRETCH - Flexion, Double Knee to Chest Lie on a firm bed or floor with both legs extended in front of you. Keeping one leg in contact with the floor, bring your opposite knee to your chest. Tense your stomach muscles to support your back and then lift your other knee to your chest. Hold your legs in place by either grabbing behind your thighs or at your knees. Pull both knees toward your chest until you feel a gentle stretch in your low back. Hold 15-20 seconds. Tense your stomach muscles and slowly return one leg at a time to the floor. Repeat 2 times. Complete this exercise 1-2 times per day.   STRETCH - Low Trunk Rotation Lie on a firm bed or floor. Keeping your legs in front of you, bend your knees so they are both pointed toward the ceiling and your feet are flat on the floor. Extend your arms out to the side. This will stabilize your upper body by keeping your shoulders in contact with the floor. Gently and slowly drop both knees together to one side until you feel a gentle stretch in your low back. Hold for  15-20 seconds. Tense your stomach muscles to support your lower back as you bring your knees back to the starting position. Repeat the exercise to the other side. Repeat 2 times. Complete this exercise 1-2 times per day  EXTENSION RANGE OF MOTION AND FLEXIBILITY EXERCISES:  STRETCH - Extension, Prone on Elbows  Lie on your stomach on the floor, a bed will be too soft. Place your palms about shoulder width apart and at the height of  your head. Place your elbows under your shoulders. If this is too painful, stack pillows under your chest. Allow your body to relax so that your hips drop lower and make contact more completely with the floor. Hold this position for 15-20 seconds. Slowly return to lying flat on the floor. Repeat 2 times. Complete this exercise 1-2 times per day.   RANGE OF MOTION - Extension, Prone Press Ups Lie on your stomach on the floor, a bed will be too soft. Place your palms about shoulder width apart and at the height of your head. Keeping your back as relaxed as possible, slowly straighten your elbows while keeping your hips on the floor. You may adjust the placement of your hands to maximize your comfort. As you gain motion, your hands will come more underneath your shoulders. Hold this position 15-20 seconds. Slowly return to lying flat on the floor. Repeat 2 times. Complete this exercise 1-2 times per day.   RANGE OF MOTION- Quadruped, Neutral Spine  Assume a hands and knees position on a firm surface. Keep your hands under your shoulders and your knees under your hips. You may place padding under your knees for comfort. Drop your head and point your tailbone toward the ground below you. This will round out your lower back like an angry cat. Hold this position for 15-20 seconds. Slowly lift your head and release your tail bone so that your back sags into a large arch, like an old horse. Hold this position for 15-20 seconds. Repeat this until you feel limber in your low back. Now, find your "sweet spot." This will be the most comfortable position somewhere between the two previous positions. This is your neutral spine. Once you have found this position, tense your stomach muscles to support your low back. Hold this position for 15-20 seconds. Repeat 2 times. Complete this exercise 1-2 times per day.  STRENGTHENING EXERCISES - Low Back Sprain These exercises may help you when beginning to  rehabilitate your injury. These exercises should be done near your "sweet spot." This is the neutral, low-back arch, somewhere between fully rounded and fully arched, that is your least painful position. When performed in this safe range of motion, these exercises can be used for people who have either a flexion or extension based injury. These exercises may resolve your symptoms with or without further involvement from your physician, physical therapist or athletic trainer. While completing these exercises, remember:  Muscles can gain both the endurance and the strength needed for everyday activities through controlled exercises. Complete these exercises as instructed by your physician, physical therapist or athletic trainer. Increase the resistance and repetitions only as guided. You may experience muscle soreness or fatigue, but the pain or discomfort you are trying to eliminate should never worsen during these exercises. If this pain does worsen, stop and make certain you are following the directions exactly. If the pain is still present after adjustments, discontinue the exercise until you can discuss the trouble with your caregiver.  STRENGTHENING - Deep Abdominals,  Pelvic Tilt  Lie on a firm bed or floor. Keeping your legs in front of you, bend your knees so they are both pointed toward the ceiling and your feet are flat on the floor. Tense your lower abdominal muscles to press your low back into the floor. This motion will rotate your pelvis so that your tail bone is scooping upwards rather than pointing at your feet or into the floor. With a gentle tension and even breathing, hold this position for 10-15 seconds. Repeat 2 times. Complete this exercise 1 time per day.   STRENGTHENING - Abdominals, Crunches  Lie on a firm bed or floor. Keeping your legs in front of you, bend your knees so they are both pointed toward the ceiling and your feet are flat on the floor. Cross your arms over your  chest. Slightly tip your chin down without bending your neck. Tense your abdominals and slowly lift your trunk high enough to just clear your shoulder blades. Lifting higher can put excessive stress on the lower back and does not further strengthen your abdominal muscles. Control your return to the starting position. Repeat 2 times. Complete this exercise once every 1-2 days.   STRENGTHENING - Quadruped, Opposite UE/LE Lift  Assume a hands and knees position on a firm surface. Keep your hands under your shoulders and your knees under your hips. You may place padding under your knees for comfort. Find your neutral spine and gently tense your abdominal muscles so that you can maintain this position. Your shoulders and hips should form a rectangle that is parallel with the floor and is not twisted. Keeping your trunk steady, lift your right hand no higher than your shoulder and then your left leg no higher than your hip. Make sure you are not holding your breath. Hold this position for 15-20 seconds. Continuing to keep your abdominal muscles tense and your back steady, slowly return to your starting position. Repeat with the opposite arm and leg. Repeat 2 times. Complete this exercise once every 1-2 days.   STRENGTHENING - Abdominals and Quadriceps, Straight Leg Raise  Lie on a firm bed or floor with both legs extended in front of you. Keeping one leg in contact with the floor, bend the other knee so that your foot can rest flat on the floor. Find your neutral spine, and tense your abdominal muscles to maintain your spinal position throughout the exercise. Slowly lift your straight leg off the floor about 6 inches for a count of 15, making sure to not hold your breath. Still keeping your neutral spine, slowly lower your leg all the way to the floor. Repeat this exercise with each leg 2 times. Complete this exercise once every 1-2 days. POSTURE AND BODY MECHANICS CONSIDERATIONS - Low Back  Sprain Keeping correct posture when sitting, standing or completing your activities will reduce the stress put on different body tissues, allowing injured tissues a chance to heal and limiting painful experiences. The following are general guidelines for improved posture. Your physician or physical therapist will provide you with any instructions specific to your needs. While reading these guidelines, remember: The exercises prescribed by your provider will help you have the flexibility and strength to maintain correct postures. The correct posture provides the best environment for your joints to work. All of your joints have less wear and tear when properly supported by a spine with good posture. This means you will experience a healthier, less painful body. Correct posture must be practiced with all  of your activities, especially prolonged sitting and standing. Correct posture is as important when doing repetitive low-stress activities (typing) as it is when doing a single heavy-load activity (lifting).  RESTING POSITIONS Consider which positions are most painful for you when choosing a resting position. If you have pain with flexion-based activities (sitting, bending, stooping, squatting), choose a position that allows you to rest in a less flexed posture. You would want to avoid curling into a fetal position on your side. If your pain worsens with extension-based activities (prolonged standing, working overhead), avoid resting in an extended position such as sleeping on your stomach. Most people will find more comfort when they rest with their spine in a more neutral position, neither too rounded nor too arched. Lying on a non-sagging bed on your side with a pillow between your knees, or on your back with a pillow under your knees will often provide some relief. Keep in mind, being in any one position for a prolonged period of time, no matter how correct your posture, can still lead to stiffness. PROPER  SITTING POSTURE In order to minimize stress and discomfort on your spine, you must sit with correct posture. Sitting with good posture should be effortless for a healthy body. Returning to good posture is a gradual process. Many people can work toward this most comfortably by using various supports until they have the flexibility and strength to maintain this posture on their own. When sitting with proper posture, your ears will fall over your shoulders and your shoulders will fall over your hips. You should use the back of the chair to support your upper back. Your lower back will be in a neutral position, just slightly arched. You may place a small pillow or folded towel at the base of your lower back for  support.  When working at a desk, create an environment that supports good, upright posture. Without extra support, muscles tire, which leads to excessive strain on joints and other tissues. Keep these recommendations in mind:  CHAIR: A chair should be able to slide under your desk when your back makes contact with the back of the chair. This allows you to work closely. The chair's height should allow your eyes to be level with the upper part of your monitor and your hands to be slightly lower than your elbows.  BODY POSITION Your feet should make contact with the floor. If this is not possible, use a foot rest. Keep your ears over your shoulders. This will reduce stress on your neck and low back.  INCORRECT SITTING POSTURES  If you are feeling tired and unable to assume a healthy sitting posture, do not slouch or slump. This puts excessive strain on your back tissues, causing more damage and pain. Healthier options include: Using more support, like a lumbar pillow. Switching tasks to something that requires you to be upright or walking. Talking a brief walk. Lying down to rest in a neutral-spine position.  PROLONGED STANDING WHILE SLIGHTLY LEANING FORWARD  When completing a task that  requires you to lean forward while standing in one place for a long time, place either foot up on a stationary 2-4 inch high object to help maintain the best posture. When both feet are on the ground, the lower back tends to lose its slight inward curve. If this curve flattens (or becomes too large), then the back and your other joints will experience too much stress, tire more quickly, and can cause pain.  CORRECT STANDING POSTURES  Proper standing posture should be assumed with all daily activities, even if they only take a few moments, like when brushing your teeth. As in sitting, your ears should fall over your shoulders and your shoulders should fall over your hips. You should keep a slight tension in your abdominal muscles to brace your spine. Your tailbone should point down to the ground, not behind your body, resulting in an over-extended swayback posture.   INCORRECT STANDING POSTURES  Common incorrect standing postures include a forward head, locked knees and/or an excessive swayback. WALKING Walk with an upright posture. Your ears, shoulders and hips should all line-up.  PROLONGED ACTIVITY IN A FLEXED POSITION When completing a task that requires you to bend forward at your waist or lean over a low surface, try to find a way to stabilize 3 out of 4 of your limbs. You can place a hand or elbow on your thigh or rest a knee on the surface you are reaching across. This will provide you more stability, so that your muscles do not tire as quickly. By keeping your knees relaxed, or slightly bent, you will also reduce stress across your lower back. CORRECT LIFTING TECHNIQUES  DO : Assume a wide stance. This will provide you more stability and the opportunity to get as close as possible to the object which you are lifting. Tense your abdominals to brace your spine. Bend at the knees and hips. Keeping your back locked in a neutral-spine position, lift using your leg muscles. Lift with your legs,  keeping your back straight. Test the weight of unknown objects before attempting to lift them. Try to keep your elbows locked down at your sides in order get the best strength from your shoulders when carrying an object.   Always ask for help when lifting heavy or awkward objects. INCORRECT LIFTING TECHNIQUES DO NOT:  Lock your knees when lifting, even if it is a small object. Bend and twist. Pivot at your feet or move your feet when needing to change directions. Assume that you can safely pick up even a paperclip without proper posture.

## 2017-08-23 NOTE — ED Triage Notes (Signed)
Pt here for chronic abd pain and chronic BLE rash   A&O x4... NAD... Ambulatory

## 2017-08-26 ENCOUNTER — Ambulatory Visit: Payer: Self-pay | Admitting: Primary Care

## 2017-08-27 ENCOUNTER — Ambulatory Visit: Payer: Self-pay | Admitting: Physician Assistant
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
## 2017-08-28 ENCOUNTER — Ambulatory Visit: Payer: Self-pay | Admitting: Physician Assistant

## 2017-08-28 ENCOUNTER — Telehealth: Payer: Self-pay | Admitting: Primary Care

## 2017-08-28 NOTE — Telephone Encounter (Signed)
Jae DireKate,  I've just been informed that some of her cancellations were made in error by the front desk, so they are not valid.  I'm happy to see her if she decides to reschedule, but I do think it is important that we document the encounter that she had at [facility]. Thanks for you cooperation!  Jarold MottoSamantha Laakea Pereira PA-C 08/28/17

## 2017-08-28 NOTE — Telephone Encounter (Signed)
Noted  

## 2017-08-28 NOTE — Telephone Encounter (Signed)
Bonita QuinLinda, see message below. Will you please document your encounter with Ms. Shultis?  I agree that we should consider not allowing her to re-schedule given numerous cancellations and her behavior this week.

## 2017-08-28 NOTE — Telephone Encounter (Signed)
-----   Message from Shady DaleSamantha Worley, [place]PA sent at 08/28/2017  7:47 AM EDT ----- Elvina SidleHey!  I was wondering if you think that maybe someone from your office could document the encounter they had with Jessica Gordon. Looking at her past appointments, it looks like she has a total of 5 cancellations with you and I. Wondering if maybe we shouldn't allow re-schedule?  Let me know your thoughts!  Happy Friday, Sam

## 2017-08-28 NOTE — Telephone Encounter (Signed)
Patient arrived at 3:42 pm for a 3:15 pm appointment for new patient to establish on 08/26/17 with Jessica Gordon.  Jessica Gordon, scheduler, checked with clinical staff to see if could accommodate patient even though she was over 25 minutes late.  Unable to see patient on Wednesday, all providers at 100% also.  Patient was agitated and having back issues scheduler referred pt to supervisor.  After I spoke with patient, I realized pt had multiple needs and our next new patient appointment not for several weeks.  [facility] for 08/27/17 with provider Jessica Gordon. Pt very satisfied with location and proximity to her home.

## 2017-10-30 ENCOUNTER — Other Ambulatory Visit (HOSPITAL_COMMUNITY): Payer: Self-pay | Admitting: *Deleted

## 2017-10-30 DIAGNOSIS — N6320 Unspecified lump in the left breast, unspecified quadrant: Secondary | ICD-10-CM

## 2017-11-10 ENCOUNTER — Ambulatory Visit (HOSPITAL_COMMUNITY): Payer: Self-pay

## 2017-11-10 ENCOUNTER — Other Ambulatory Visit: Payer: Self-pay

## 2017-12-04 ENCOUNTER — Other Ambulatory Visit (HOSPITAL_COMMUNITY): Payer: Self-pay | Admitting: *Deleted

## 2017-12-04 DIAGNOSIS — N6320 Unspecified lump in the left breast, unspecified quadrant: Secondary | ICD-10-CM

## 2017-12-24 ENCOUNTER — Other Ambulatory Visit: Payer: Self-pay

## 2017-12-24 ENCOUNTER — Ambulatory Visit (HOSPITAL_COMMUNITY): Payer: Self-pay

## 2018-01-26 ENCOUNTER — Other Ambulatory Visit: Payer: Self-pay

## 2018-01-26 ENCOUNTER — Inpatient Hospital Stay: Admission: RE | Admit: 2018-01-26 | Payer: Self-pay | Source: Ambulatory Visit

## 2018-01-26 ENCOUNTER — Ambulatory Visit (HOSPITAL_COMMUNITY): Payer: Self-pay

## 2018-03-18 ENCOUNTER — Other Ambulatory Visit: Payer: Self-pay

## 2018-03-18 ENCOUNTER — Ambulatory Visit (HOSPITAL_COMMUNITY): Payer: Self-pay

## 2018-04-06 ENCOUNTER — Telehealth (HOSPITAL_COMMUNITY): Payer: Self-pay | Admitting: *Deleted

## 2018-04-06 NOTE — Telephone Encounter (Signed)
Telephoned patient at home number and left message to return call to [facility] 

## 2018-04-12 ENCOUNTER — Telehealth (HOSPITAL_COMMUNITY): Payer: Self-pay | Admitting: *Deleted

## 2018-04-12 NOTE — Telephone Encounter (Signed)
Telephoned patient at home number and left message to return call to [facility] 

## 2020-02-09 ENCOUNTER — Ambulatory Visit: Payer: Self-pay | Attending: Internal Medicine | Admitting: Internal Medicine

## 2020-02-09 ENCOUNTER — Encounter: Payer: Self-pay | Admitting: Internal Medicine

## 2020-02-09 ENCOUNTER — Other Ambulatory Visit: Payer: Self-pay

## 2020-02-09 VITALS — Ht 66.0 in

## 2020-02-09 DIAGNOSIS — Z538 Procedure and treatment not carried out for other reasons: Secondary | ICD-10-CM
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
## 2020-02-10 ENCOUNTER — Other Ambulatory Visit: Payer: Self-pay

## 2020-02-10 ENCOUNTER — Ambulatory Visit: Payer: Self-pay

## 2020-03-05 ENCOUNTER — Encounter: Payer: Self-pay | Admitting: Family

## 2020-03-05 ENCOUNTER — Ambulatory Visit: Payer: Self-pay | Attending: Internal Medicine | Admitting: Family

## 2020-03-05 ENCOUNTER — Other Ambulatory Visit: Payer: Self-pay

## 2020-03-05 VITALS — BP 122/76 | HR 83 | Temp 98.9°F | Ht 66.0 in | Wt 131.6 lb

## 2020-03-05 DIAGNOSIS — G8929 Other chronic pain: Secondary | ICD-10-CM

## 2020-03-05 DIAGNOSIS — M5442 Lumbago with sciatica, left side: Secondary | ICD-10-CM

## 2020-03-05 DIAGNOSIS — Z7689 Persons encountering health services in other specified circumstances: Secondary | ICD-10-CM

## 2020-03-05 DIAGNOSIS — M5441 Lumbago with sciatica, right side: Secondary | ICD-10-CM

## 2020-03-05 MED ORDER — NAPROXEN 500 MG PO TABS: 500.0000 mg | ORAL_TABLET | Freq: Two times a day (BID) | ORAL | 0 refills | Status: DC
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
## 2020-03-05 NOTE — Patient Instructions (Addendum)
Follow-up with attending physician in 1 months for physical exam and PAP smear. Collect labs during today's visit. Acute Back Pain, Adult Acute back pain is sudden and usually short-lived. It is often caused by an injury to the muscles and tissues in the back. The injury may result from:  A muscle or ligament getting overstretched or torn (strained). Ligaments are tissues that connect bones to each other. Lifting something improperly can cause a back strain.  Wear and tear (degeneration) of the spinal disks. Spinal disks are circular tissue that provides cushioning between the bones of the spine (vertebrae).  Twisting motions, such as while playing sports or doing yard work.  A hit to the back.  Arthritis. You may have a physical exam, lab tests, and imaging tests to find the cause of your pain. Acute back pain usually goes away with rest and home care. Follow these instructions at home: Managing pain, stiffness, and swelling  Take over-the-counter and prescription medicines only as told by your health care provider.  Your health care provider may recommend applying ice during the first 24-48 hours after your pain starts. To do this: ? Put ice in a plastic bag. ? Place a towel between your skin and the bag. ? Leave the ice on for 20 minutes, 2-3 times a day.  If directed, apply heat to the affected area as often as told by your health care provider. Use the heat source that your health care provider recommends, such as a moist heat pack or a heating pad. ? Place a towel between your skin and the heat source. ? Leave the heat on for 20-30 minutes. ? Remove the heat if your skin turns bright red. This is especially important if you are unable to feel pain, heat, or cold. You have a greater risk of getting burned. Activity   Do not stay in bed. Staying in bed for more than 1-2 days can delay your recovery.  Sit up and stand up straight. Avoid leaning forward when you sit, or hunching  over when you stand. ? If you work at a desk, sit close to it so you do not need to lean over. Keep your chin tucked in. Keep your neck drawn back, and keep your elbows bent at a right angle. Your arms should look like the letter "L." ? Sit high and close to the steering wheel when you drive. Add lower back (lumbar) support to your car seat, if needed.  Take short walks on even surfaces as soon as you are able. Try to increase the length of time you walk each day.  Do not sit, drive, or stand in one place for more than 30 minutes at a time. Sitting or standing for long periods of time can put stress on your back.  Do not drive or use heavy machinery while taking prescription pain medicine.  Use proper lifting techniques. When you bend and lift, use positions that put less stress on your back: ? [place] your knees. ? Keep the load close to your body. ? Avoid twisting.  Exercise regularly as told by your health care provider. Exercising helps your back heal faster and helps prevent back injuries by keeping muscles strong and flexible.  Work with a physical therapist to make a safe exercise program, as recommended by your health care provider. Do any exercises as told by your physical therapist. Lifestyle  Maintain a healthy weight. Extra weight puts stress on your back and makes it difficult to have good  posture.  Avoid activities or situations that make you feel anxious or stressed. Stress and anxiety increase muscle tension and can make back pain worse. Learn ways to manage anxiety and stress, such as through exercise. General instructions  Sleep on a firm mattress in a comfortable position. Try lying on your side with your knees slightly bent. If you lie on your back, put a pillow under your knees.  Follow your treatment plan as told by your health care provider. This may include: ? Cognitive or behavioral therapy. ? Acupuncture or massage therapy. ? Meditation or yoga. Contact a health  care provider if:  You have pain that is not relieved with rest or medicine.  You have increasing pain going down into your legs or buttocks.  Your pain does not improve after 2 weeks.  You have pain at night.  You lose weight without trying.  You have a fever or chills. Get help right away if:  You develop new bowel or bladder control problems.  You have unusual weakness or numbness in your arms or legs.  You develop nausea or vomiting.  You develop abdominal pain.  You feel faint. Summary  Acute back pain is sudden and usually short-lived.  Use proper lifting techniques. When you bend and lift, use positions that put less stress on your back.  Take over-the-counter and prescription medicines and apply heat or ice as directed by your health care provider. This information is not intended to replace advice given to you by your health care provider. Make sure you discuss any questions you have with your health care provider. Document Revised: 03/29/2019 Document Reviewed: 07/22/2017 Elsevier Patient Education  [place].

## 2020-03-05 NOTE — Progress Notes (Signed)
Est care  Possible pain management  Sciatica pain  Stomach pain  No sensation in fingers(both hands)

## 2020-03-06 ENCOUNTER — Telehealth: Payer: Self-pay | Admitting: *Deleted

## 2020-03-06 LAB — CBC WITH DIFFERENTIAL
Basophils Absolute: 0 10*3/uL (ref 0.0–0.2)
Basos: 0 %
EOS (ABSOLUTE): 0 10*3/uL (ref 0.0–0.4)
Eos: 0 %
Hematocrit: 42.8 % (ref 34.0–46.6)
Hemoglobin: 14.6 g/dL (ref 11.1–15.9)
Immature Grans (Abs): 0 10*3/uL (ref 0.0–0.1)
Immature Granulocytes: 0 %
Lymphocytes Absolute: 2.2 10*3/uL (ref 0.7–3.1)
Lymphs: 20 %
MCH: 33.6 pg — ABNORMAL HIGH (ref 26.6–33.0)
MCHC: 34.1 g/dL (ref 31.5–35.7)
MCV: 99 fL — ABNORMAL HIGH (ref 79–97)
Monocytes Absolute: 0.6 10*3/uL (ref 0.1–0.9)
Monocytes: 5 %
Neutrophils Absolute: 8.4 10*3/uL — ABNORMAL HIGH (ref 1.4–7.0)
Neutrophils: 75 %
RBC: 4.34 x10E6/uL (ref 3.77–5.28)
RDW: 11.6 % — ABNORMAL LOW (ref 11.7–15.4)
WBC: 11.2 10*3/uL — ABNORMAL HIGH (ref 3.4–10.8)

## 2020-03-06 LAB — COMPREHENSIVE METABOLIC PANEL
ALT: 23 IU/L (ref 0–32)
AST: 24 IU/L (ref 0–40)
Albumin/Globulin Ratio: 1.5 (ref 1.2–2.2)
Albumin: 4.4 g/dL (ref 3.8–4.8)
Alkaline Phosphatase: 73 IU/L (ref 39–117)
BUN/Creatinine Ratio: 14 (ref 9–23)
BUN: 10 mg/dL (ref 6–24)
Bilirubin Total: 0.2 mg/dL (ref 0.0–1.2)
CO2: 24 mmol/L (ref 20–29)
Calcium: 9.7 mg/dL (ref 8.7–10.2)
Chloride: 98 mmol/L (ref 96–106)
Creatinine, Ser: 0.74 mg/dL (ref 0.57–1.00)
GFR calc Af Amer: 113 mL/min/{1.73_m2} (ref >59)
GFR calc non Af Amer: 98 mL/min/{1.73_m2} (ref >59)
Globulin, Total: 2.9 g/dL (ref 1.5–4.5)
Glucose: 98 mg/dL (ref 65–99)
Potassium: 4.8 mmol/L (ref 3.5–5.2)
Sodium: 137 mmol/L (ref 134–144)
Total Protein: 7.3 g/dL (ref 6.0–8.5)

## 2020-03-06 LAB — LIPID PANEL
Chol/HDL Ratio: 2.3 ratio (ref 0.0–4.4)
Cholesterol, Total: 188 mg/dL (ref 100–199)
HDL: 81 mg/dL (ref >39)
LDL Chol Calc (NIH): 94 mg/dL (ref 0–99)
Triglycerides: 71 mg/dL (ref 0–149)
VLDL Cholesterol Cal: 13 mg/dL (ref 5–40)

## 2020-03-06 LAB — HEMOGLOBIN A1C
Est. average glucose Bld gHb Est-mCnc: 105 mg/dL
Hgb A1c MFr Bld: 5.3 % (ref 4.8–5.6)

## 2020-03-06 LAB — TSH: TSH: 1.83 u[IU]/mL (ref 0.450–4.500)

## 2020-03-06 MED FILL — NAPROXEN 500 MG TABLET: 500 | 15 days supply | Qty: 30 | Fill #0

## 2020-03-06 NOTE — Telephone Encounter (Signed)
Please call patient with update. An xray of back has been ordered. Patient should go to [facility] Radiology Department for imaging. Once xray results will then send referral to orthopedic specialist for management of chronic pain if needed. Xray must be completed before any referrals can be ordered so that the cause of pain may be determined.

## 2020-03-06 NOTE — Progress Notes (Signed)
CMP, lipid panel, TSH, A1C normal.  CBC - WBCs are elevated and are the same since last labs were collected. Neutrophils elevated. MCV and MCH elevated with RDW being decreased. These values may be related to macrocytic anemia. Have added a vitamin B12 lab considering patient also has symptoms of bilateral hands and fingers paresthesias.

## 2020-03-06 NOTE — Addendum Note (Signed)
Addended by: Rema Fendt on: 03/06/2020 08:34 AM   Modules accepted: Orders

## 2020-03-06 NOTE — Addendum Note (Signed)
Addended by: Rema Fendt on: 03/06/2020 02:16 PM   Modules accepted: Orders

## 2020-03-06 NOTE — Telephone Encounter (Signed)
Spoke with patient and she was asking if the referral for the pain clinic was put in and when was she going to hear back due to needing medications. Informed pt that while she was in her appt with provider she informed provider that she was going to take ibuprofen. Per pt, she wants the referral for pain clinic.

## 2020-03-07 NOTE — Telephone Encounter (Signed)
Not able to reach patient and message states patient voicemail box is full

## 2020-03-07 NOTE — Telephone Encounter (Signed)
Called patient to inform her with what provider stated and was not able to reach her. Voicemail box is full.

## 2020-03-08 LAB — SPECIMEN STATUS REPORT

## 2020-03-08 LAB — VITAMIN B12: Vitamin B-12: 1200 pg/mL (ref 232–1245)

## 2020-03-08 NOTE — Progress Notes (Signed)
Vitamin B12 normal

## 2020-03-12 NOTE — Telephone Encounter (Signed)
Informed patient with what provider stated and she verbalized understanding and agreed to get the xray completed.

## 2020-03-23 ENCOUNTER — Other Ambulatory Visit: Payer: Self-pay

## 2020-03-23 ENCOUNTER — Ambulatory Visit (HOSPITAL_COMMUNITY)
Admission: RE | Admit: 2020-03-23 | Discharge: 2020-03-23 | Disposition: A | Discharge status: Home / Self Care | Payer: Self-pay | Source: Ambulatory Visit | Attending: Family | Admitting: Family

## 2020-03-23 DIAGNOSIS — M5441 Lumbago with sciatica, right side: Secondary | ICD-10-CM | POA: Insufficient documentation

## 2020-03-23 DIAGNOSIS — M5442 Lumbago with sciatica, left side: Secondary | ICD-10-CM | POA: Insufficient documentation

## 2020-03-23 DIAGNOSIS — G8929 Other chronic pain: Secondary | ICD-10-CM | POA: Insufficient documentation

## 2020-03-27 ENCOUNTER — Telehealth: Payer: Self-pay

## 2020-03-27 NOTE — Progress Notes (Signed)
Please call patient with update.   X-ray of back has no acute findings. This means the back is aligned normal and no fractures present.   T12 has a compression abnormality but has been present on scans in the past as this is the patient's baseline.   No additional treatment at this time. Patient may follow-up with primary provider as needed.

## 2020-03-27 NOTE — Telephone Encounter (Signed)
Contacted pt to go over xray results pt didn't answer lvm asking pt to give a call back at her earliest convenience  

## 2020-04-16 ENCOUNTER — Other Ambulatory Visit: Payer: Self-pay

## 2020-04-16 ENCOUNTER — Ambulatory Visit: Payer: Self-pay | Attending: Internal Medicine | Admitting: Internal Medicine

## 2020-04-16 ENCOUNTER — Encounter: Payer: Self-pay | Admitting: Internal Medicine

## 2020-04-16 VITALS — BP 109/74 | HR 73 | Temp 98.1°F | Resp 16 | Wt 128.6 lb

## 2020-04-16 DIAGNOSIS — R52 Pain, unspecified: Secondary | ICD-10-CM

## 2020-04-16 DIAGNOSIS — F17200 Nicotine dependence, unspecified, uncomplicated: Secondary | ICD-10-CM

## 2020-04-16 DIAGNOSIS — Z114 Encounter for screening for human immunodeficiency virus [HIV]: Secondary | ICD-10-CM

## 2020-04-16 DIAGNOSIS — N6320 Unspecified lump in the left breast, unspecified quadrant: Secondary | ICD-10-CM

## 2020-04-16 DIAGNOSIS — M545 Low back pain, unspecified: Secondary | ICD-10-CM

## 2020-04-16 DIAGNOSIS — R202 Paresthesia of skin: Secondary | ICD-10-CM

## 2020-04-16 DIAGNOSIS — L309 Dermatitis, unspecified: Secondary | ICD-10-CM

## 2020-04-16 DIAGNOSIS — Z1159 Encounter for screening for other viral diseases: Secondary | ICD-10-CM

## 2020-04-16 DIAGNOSIS — Z0000 Encounter for general adult medical examination without abnormal findings: Secondary | ICD-10-CM

## 2020-04-16 DIAGNOSIS — Z124 Encounter for screening for malignant neoplasm of cervix: Secondary | ICD-10-CM

## 2020-04-16 DIAGNOSIS — R200 Anesthesia of skin: Secondary | ICD-10-CM

## 2020-04-16 DIAGNOSIS — Z6379 Other stressful life events affecting family and household: Secondary | ICD-10-CM

## 2020-04-16 DIAGNOSIS — G8929 Other chronic pain: Secondary | ICD-10-CM

## 2020-04-16 MED ORDER — PREGABALIN 25 MG PO CAPS: 25.0000 mg | ORAL_CAPSULE | Freq: Two times a day (BID) | ORAL | 1 refills | Status: DC

## 2020-04-16 MED ORDER — DICLOFENAC SODIUM 1 % EX GEL: 2.0000 g | Freq: Four times a day (QID) | CUTANEOUS | 1 refills | Status: DC

## 2020-04-16 MED ORDER — LIDOCAINE-PRILOCAINE 2.5-2.5 % EX CREA: 1.0000 "application " | TOPICAL_CREAM | CUTANEOUS | 0 refills | Status: DC | PRN

## 2020-04-16 MED ORDER — TRIAMCINOLONE ACETONIDE 0.1 % EX CREA: 1.0000 "application " | TOPICAL_CREAM | Freq: Two times a day (BID) | CUTANEOUS | 0 refills | Status: DC

## 2020-04-16 MED FILL — DICLOFENAC SODIUM 1% GEL: 1 | 12 days supply | Qty: 100 | Fill #0

## 2020-04-16 MED FILL — LIDOCAINE-PRILOCAINE 2.5-2.: 2.5-2.5 | 15 days supply | Qty: 30 | Fill #0

## 2020-04-16 MED FILL — PREGABALIN 25 MG CAPS: 25 | 30 days supply | Qty: 60 | Fill #0

## 2020-04-16 MED FILL — TRIAMCINOLONE ACETONIDE 0.1: 0.1 | 15 days supply | Qty: 30 | Fill #0
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
## 2020-04-16 NOTE — Progress Notes (Signed)
Pt states she is having back, stomach, b/l hand and feet pain  Pt states her feet feels like their is needles sticking in them

## 2020-04-17 ENCOUNTER — Telehealth: Payer: Self-pay

## 2020-04-17 ENCOUNTER — Encounter: Payer: Self-pay | Admitting: Internal Medicine

## 2020-04-17 DIAGNOSIS — F17200 Nicotine dependence, unspecified, uncomplicated: Secondary | ICD-10-CM | POA: Insufficient documentation

## 2020-04-17 DIAGNOSIS — L309 Dermatitis, unspecified: Secondary | ICD-10-CM | POA: Insufficient documentation

## 2020-04-17 DIAGNOSIS — M545 Low back pain, unspecified: Secondary | ICD-10-CM | POA: Insufficient documentation

## 2020-04-17 DIAGNOSIS — G8929 Other chronic pain: Secondary | ICD-10-CM | POA: Insufficient documentation

## 2020-04-17 DIAGNOSIS — N6320 Unspecified lump in the left breast, unspecified quadrant: Secondary | ICD-10-CM | POA: Insufficient documentation

## 2020-04-17 DIAGNOSIS — Z6379 Other stressful life events affecting family and household: Secondary | ICD-10-CM | POA: Insufficient documentation

## 2020-04-17 DIAGNOSIS — R52 Pain, unspecified: Secondary | ICD-10-CM | POA: Insufficient documentation

## 2020-04-17 LAB — CERVICOVAGINAL ANCILLARY ONLY
Bacterial Vaginitis (gardnerella): NEGATIVE
Candida Glabrata: NEGATIVE
Candida Vaginitis: POSITIVE — AB
Chlamydia: NEGATIVE
Comment: NEGATIVE
Comment: NEGATIVE
Comment: NEGATIVE
Comment: NEGATIVE
Comment: NEGATIVE
Comment: NORMAL
Neisseria Gonorrhea: NEGATIVE
Trichomonas: NEGATIVE

## 2020-04-17 LAB — HEPATITIS C ANTIBODY: Hep C Virus Ab: <0.1 s/co ratio (ref 0.0–0.9)

## 2020-04-17 LAB — HIV ANTIBODY (ROUTINE TESTING W REFLEX): HIV Screen 4th Generation wRfx: NONREACTIVE

## 2020-04-17 NOTE — Telephone Encounter (Signed)
Contacted pt to go over lab results pt is aware and doesn't have any questions or concerns 

## 2020-04-18 ENCOUNTER — Other Ambulatory Visit: Payer: Self-pay | Admitting: Internal Medicine

## 2020-04-18 ENCOUNTER — Telehealth: Payer: Self-pay

## 2020-04-18 LAB — CYTOLOGY - PAP
Adequacy: ABSENT
Comment: NEGATIVE
Diagnosis: NEGATIVE
High risk HPV: NEGATIVE

## 2020-04-18 MED ORDER — FLUCONAZOLE 150 MG PO TABS: 150.0000 mg | ORAL_TABLET | Freq: Once | ORAL | 0 refills | Status: AC

## 2020-04-18 MED FILL — FLUCONAZOLE 150 MG TABLET: 150 | 1 days supply | Qty: 1 | Fill #0

## 2020-04-18 NOTE — Telephone Encounter (Signed)
Contacted pt to go over lab results pt is aware and doesn't have any questions or concerns 

## 2020-04-19 ENCOUNTER — Other Ambulatory Visit: Payer: Self-pay

## 2020-04-19 ENCOUNTER — Ambulatory Visit: Payer: Self-pay

## 2020-04-19 DIAGNOSIS — N644 Mastodynia: Secondary | ICD-10-CM

## 2020-04-19 NOTE — Addendum Note (Signed)
Addended by: Narda Rutherford on: 04/19/2020 11:25 AM   Modules accepted: Orders

## 2020-04-26 ENCOUNTER — Ambulatory Visit: Payer: Self-pay

## 2020-04-27 ENCOUNTER — Other Ambulatory Visit: Payer: Self-pay

## 2020-05-31 ENCOUNTER — Emergency Department (HOSPITAL_COMMUNITY)
Admission: EM | Admit: 2020-05-31 | Discharge: 2020-06-01 | Disposition: A | Discharge status: Home / Self Care | Payer: Self-pay | Attending: Emergency Medicine | Admitting: Emergency Medicine

## 2020-05-31 ENCOUNTER — Other Ambulatory Visit: Payer: Self-pay

## 2020-05-31 ENCOUNTER — Encounter (HOSPITAL_COMMUNITY): Payer: Self-pay

## 2020-05-31 DIAGNOSIS — F1310 Sedative, hypnotic or anxiolytic abuse, uncomplicated: Secondary | ICD-10-CM | POA: Insufficient documentation

## 2020-05-31 DIAGNOSIS — Z79899 Other long term (current) drug therapy: Secondary | ICD-10-CM | POA: Insufficient documentation

## 2020-05-31 DIAGNOSIS — F17210 Nicotine dependence, cigarettes, uncomplicated: Secondary | ICD-10-CM | POA: Insufficient documentation

## 2020-05-31 DIAGNOSIS — F1910 Other psychoactive substance abuse, uncomplicated: Secondary | ICD-10-CM

## 2020-05-31 DIAGNOSIS — T783XXA Angioneurotic edema, initial encounter: Secondary | ICD-10-CM | POA: Insufficient documentation

## 2020-05-31 MED ORDER — METHYLPREDNISOLONE SODIUM SUCC 125 MG IJ SOLR
125.0000 mg | Freq: Once | INTRAMUSCULAR | Status: AC
  Administered 2020-05-31: 125 mg via INTRAVENOUS
  Filled 2020-05-31: qty 2

## 2020-05-31 MED ORDER — FAMOTIDINE IN NACL 20-0.9 MG/50ML-% IV SOLN
20.0000 mg | Freq: Once | INTRAVENOUS | Status: AC
  Administered 2020-05-31: 20 mg via INTRAVENOUS
  Filled 2020-05-31: qty 50

## 2020-05-31 NOTE — ED Triage Notes (Signed)
Pt presents with swollen tongue. Endorses drug use, but unsure what. Pt is writing on a piece of paper to communicate.

## 2020-05-31 NOTE — ED Provider Notes (Signed)
Jessica Gordon   CSN: [id number] Arrival date & time: 05/31/20  2249     History Chief Complaint  Patient presents with  . Oral Swelling    Tongue    Jessica Gordon is a 44 y.o. female.  44 yo F with a chief complaints of tongue swelling.  Per EMS this started just prior to their arrival.  The things been going on for about 45 minutes or so.  She has been having difficulty talking due to the tongue swelling.  Limited history.  Level 5 caveat.  There is some thought that maybe she was taking benzodiazepines this evening.  No known blood pressure medications.  No prior reactions like this.  She was given epinephrine and Benadryl in route.  The history is provided by the patient.  Illness Severity:  Severe Onset quality:  Sudden Duration:  1 hour Timing:  Constant Progression:  Unchanged Chronicity:  New Associated symptoms: no chest pain, no congestion, no fever, no headaches, no myalgias, no nausea, no rhinorrhea, no shortness of breath, no vomiting and no wheezing        Past Medical History:  Diagnosis Date  . Depression   . Sciatica     Patient Active Problem List   Diagnosis Date Noted  . Lump of left breast 04/17/2020  . Pain, generalized 04/17/2020  . Chronic bilateral low back pain 04/17/2020  . Tobacco dependence 04/17/2020  . Stressful life event affecting family 04/17/2020  . Dermatitis 04/17/2020  . Abdominal tenderness 04/14/2012    Past Surgical History:  Procedure Laterality Date  . LAPAROSCOPY  2008   Exploratory  . left forearm fracture s/p pinning       OB History   No obstetric history on file.     Family History  Problem Relation Age of Onset  . Prostate cancer Paternal Grandfather   . Cancer Other        Prostate Cancer- Grandparent  . Alcohol abuse Neg Hx   . Asthma Neg Hx   . Drug abuse Neg Hx   . Diabetes Neg Hx   . Heart disease Neg Hx   . Hyperlipidemia Neg Hx   . Stroke Neg Hx      Social History   Tobacco Use  . Smoking status: Current Some Day Smoker    Packs/day: 0.50    Years: 5.00    Pack years: 2.50    Types: Cigarettes  . Smokeless tobacco: Never Used  . Tobacco comment: ESTIMATED 1 PACK PER WEEK  Vaping Use  . Vaping Use: Some days  . Substances: Nicotine, Flavoring  Substance Use Topics  . Alcohol use: Not Currently    Alcohol/week: 1.0 standard drink    Types: 1 Glasses of wine per week  . Drug use: No    Home Medications Prior to Admission medications   Medication Sig Start Date End Date Taking? Authorizing Provider  diclofenac Sodium (VOLTAREN) 1 % GEL Apply 2 g topically 4 (four) times daily. 04/16/20   Marcine Matar, MD  DOXEPIN HCL PO Take by mouth at bedtime.    [provider]  gabapentin (NEURONTIN) 100 MG capsule Take 100 mg by mouth 3 (three) times daily.    [provider]  lidocaine-prilocaine (EMLA) cream Apply 1 application topically as needed. 04/16/20   Marcine Matar, MD  mirtazapine (REMERON) 30 MG tablet Take 30 mg by mouth at bedtime.    [provider]  Multiple Vitamin (  MULTIVITAMIN) capsule Take 1 capsule by mouth daily.    [provider]  naproxen (NAPROSYN) 500 MG tablet Take 1 tablet (500 mg total) by mouth 2 (two) times daily with a meal. 03/05/20   Camillia Herter, NP  pregabalin (LYRICA) 25 MG capsule Take 1 capsule (25 mg total) by mouth 2 (two) times daily. 04/16/20   Ladell Pier, MD  triamcinolone cream (KENALOG) 0.1 % Apply 1 application topically 2 (two) times daily. 04/16/20   Ladell Pier, MD    Allergies    Patient has no known allergies.  Review of Systems   Review of Systems  Unable to perform ROS: Patient nonverbal  Constitutional: Negative for chills and fever.  HENT: Negative for congestion and rhinorrhea.   Eyes: Negative for redness and visual disturbance.  Respiratory: Negative for shortness of breath and wheezing.   Cardiovascular:  Negative for chest pain and palpitations.  Gastrointestinal: Negative for nausea and vomiting.  Genitourinary: Negative for dysuria and urgency.  Musculoskeletal: Negative for arthralgias and myalgias.  Skin: Negative for pallor and wound.  Neurological: Negative for dizziness and headaches.    Physical Exam Updated Vital Signs There were no vitals taken for this visit.  Physical Exam Vitals and nursing Gordon reviewed.  Constitutional:      General: She is not in acute distress.    Appearance: She is well-developed. She is not diaphoretic.  HENT:     Head: Normocephalic and atraumatic.     Comments: Tongue appears to be markedly swollen.  Mild tenderness to palpation sublingually.  No obvious sublingual swelling.  Tolerating secretions without difficulty.  I am able to visualize the base of the uvula with the tongue blade.  Able to rotate her head without issue. Eyes:     Pupils: Pupils are equal, round, and reactive to light.  Cardiovascular:     Rate and Rhythm: Normal rate and regular rhythm.     Heart sounds: No murmur heard.  No friction rub. No gallop.   Pulmonary:     Effort: Pulmonary effort is normal.     Breath sounds: No wheezing or rales.  Abdominal:     General: There is no distension.     Palpations: Abdomen is soft.     Tenderness: There is no abdominal tenderness.  Musculoskeletal:        General: No tenderness.     Cervical back: Normal range of motion and neck supple.  Skin:    General: Skin is warm and dry.  Neurological:     Mental Status: She is alert and oriented to person, place, and time.  Psychiatric:        Behavior: Behavior normal.     ED Results / Procedures / Treatments   Labs (all labs ordered are listed, but only abnormal results are displayed) Labs Reviewed - No data to display  EKG None  Radiology No results found.  Procedures Procedures (including critical care time)  Medications Ordered in ED Medications    methylPREDNISolone sodium succinate (SOLU-MEDROL) 125 mg/2 mL injection 125 mg (125 mg Intravenous Given 05/31/20 2343)  famotidine (PEPCID) IVPB 20 mg premix (20 mg Intravenous New Bag/Given 05/31/20 2343)    ED Course  I have reviewed the triage vital signs and the nursing notes.  Pertinent labs & imaging results that were available during my care of the patient were reviewed by me and considered in my medical decision making (see chart for details).    MDM Rules/Calculators/A&P  44 yo F with a chief complaints of tongue swelling.  History is limited as the patient is unable to talk due to the swelling.  Per EMS this happened abruptly.  She is very sleepy which is likely due to the Benadryl or from the benzodiazepine use that was reported.  At this point there is no obvious airway implication.  We will continue to observe.  Give Solu-Medrol Pepcid.  Mildly more alert on repeat exam, more communicative.  No obvious increase of swelling.   Will continue to obs in the ED.   The patients results and plan were reviewed and discussed.   Any x-rays performed were independently reviewed by myself.   Differential diagnosis were considered with the presenting HPI.  Medications  methylPREDNISolone sodium succinate (SOLU-MEDROL) 125 mg/2 mL injection 125 mg (125 mg Intravenous Given 05/31/20 2343)  famotidine (PEPCID) IVPB 20 mg premix (20 mg Intravenous New Bag/Given 05/31/20 2343)    There were no vitals filed for this visit.  Final diagnoses:  Angioedema, initial encounter  Polysubstance abuse [facility])    Admission/ observation were discussed with the admitting physician, patient and/or family and they are comfortable with the plan.   Final Clinical Impression(s) / ED Diagnoses Final diagnoses:  Angioedema, initial encounter  Polysubstance abuse [facility])    Rx / DC Orders ED Discharge Orders    None       Melene Plan, DO 06/01/20 0040

## 2020-06-01 LAB — ACETAMINOPHEN LEVEL: Acetaminophen (Tylenol), Serum: <10 ug/mL — ABNORMAL LOW (ref 10–30)

## 2020-06-01 LAB — RAPID URINE DRUG SCREEN, HOSP PERFORMED
Amphetamines: POSITIVE — AB
Barbiturates: NOT DETECTED
Benzodiazepines: POSITIVE — AB
Cocaine: NOT DETECTED
Opiates: POSITIVE — AB
Tetrahydrocannabinol: NOT DETECTED

## 2020-06-01 LAB — COMPREHENSIVE METABOLIC PANEL
ALT: 52 U/L — ABNORMAL HIGH (ref 0–44)
AST: 96 U/L — ABNORMAL HIGH (ref 15–41)
Albumin: 4.6 g/dL (ref 3.5–5.0)
Alkaline Phosphatase: 65 U/L (ref 38–126)
Anion gap: 14 (ref 5–15)
BUN: 12 mg/dL (ref 6–20)
CO2: 24 mmol/L (ref 22–32)
Calcium: 9.3 mg/dL (ref 8.9–10.3)
Chloride: 100 mmol/L (ref 98–111)
Creatinine, Ser: 0.64 mg/dL (ref 0.44–1.00)
GFR calc Af Amer: >60 mL/min (ref >60)
GFR calc non Af Amer: >60 mL/min (ref >60)
Glucose, Bld: 111 mg/dL — ABNORMAL HIGH (ref 70–99)
Potassium: 3.5 mmol/L (ref 3.5–5.1)
Sodium: 138 mmol/L (ref 135–145)
Total Bilirubin: 0.9 mg/dL (ref 0.3–1.2)
Total Protein: 7.8 g/dL (ref 6.5–8.1)

## 2020-06-01 LAB — SALICYLATE LEVEL: Salicylate Lvl: <7 mg/dL — ABNORMAL LOW (ref 7.0–30.0)

## 2020-06-01 LAB — URINALYSIS, ROUTINE W REFLEX MICROSCOPIC
Bilirubin Urine: NEGATIVE
Glucose, UA: NEGATIVE mg/dL
Hgb urine dipstick: NEGATIVE
Ketones, ur: 20 mg/dL — AB
Leukocytes,Ua: NEGATIVE
Nitrite: NEGATIVE
Protein, ur: NEGATIVE mg/dL
Specific Gravity, Urine: 1.02 (ref 1.005–1.030)
pH: 6 (ref 5.0–8.0)

## 2020-06-01 LAB — CBC WITH DIFFERENTIAL/PLATELET
Abs Immature Granulocytes: 0.02 10*3/uL (ref 0.00–0.07)
Basophils Absolute: 0 10*3/uL (ref 0.0–0.1)
Basophils Relative: 0 %
Eosinophils Absolute: 0 10*3/uL (ref 0.0–0.5)
Eosinophils Relative: 0 %
HCT: 38.1 % (ref 36.0–46.0)
Hemoglobin: 13 g/dL (ref 12.0–15.0)
Immature Granulocytes: 0 %
Lymphocytes Relative: 4 %
Lymphs Abs: 0.6 10*3/uL — ABNORMAL LOW (ref 0.7–4.0)
MCH: 33.6 pg (ref 26.0–34.0)
MCHC: 34.1 g/dL (ref 30.0–36.0)
MCV: 98.4 fL (ref 80.0–100.0)
Monocytes Absolute: 0.1 10*3/uL (ref 0.1–1.0)
Monocytes Relative: 1 %
Neutro Abs: 12.9 10*3/uL — ABNORMAL HIGH (ref 1.7–7.7)
Neutrophils Relative %: 95 %
Platelets: 252 10*3/uL (ref 150–400)
RBC: 3.87 MIL/uL (ref 3.87–5.11)
RDW: 13.4 % (ref 11.5–15.5)
WBC: 13.6 10*3/uL — ABNORMAL HIGH (ref 4.0–10.5)
nRBC: 0 % (ref 0.0–0.2)

## 2020-06-01 LAB — ETHANOL: Alcohol, Ethyl (B): <10 mg/dL (ref <10)

## 2020-06-01 LAB — PREGNANCY, URINE: Preg Test, Ur: NEGATIVE

## 2020-06-01 MED ORDER — EPINEPHRINE 0.3 MG/0.3ML IJ SOAJ: 0.3000 mg | INTRAMUSCULAR | 0 refills | Status: DC | PRN

## 2020-06-01 MED ORDER — PREDNISONE 10 MG PO TABS: 20.0000 mg | ORAL_TABLET | Freq: Two times a day (BID) | ORAL | 0 refills | Status: DC

## 2020-06-01 NOTE — ED Provider Notes (Signed)
Care assumed from Dr. Adela Lank at shift change.  Patient initially presented here with complaints of tongue swelling.  This began after she took additional doses of her anxiety medication.  Patient initially seen by Dr. Adela Lank who believed she was experiencing angioedema.  She was given epinephrine in route to the hospital by EMS and received Pepcid and Solu-Medrol here in the ER.  She has been observed for nearly 6 hours in the ER during which time her tongue swelling has nearly completely resolved.  I am uncertain as to the exact etiology of the tongue swelling, however angioedema is a possibility.  This also could be some sort of allergic reaction, however she denies any new contacts or exposures.  At this point, she appears appropriate for discharge.  She has called a friend who has come to pick her up.  She has displayed some erratic speech while here in the ER and I suspect that this is related to her taking additional benzodiazepines this evening.  She denies to me that she made any attempt to harm herself and denies being suicidal or homicidal.   Geoffery Lyons, MD 06/01/20 [phone]

## 2020-06-01 NOTE — Discharge Instructions (Signed)
Begin taking prednisone as prescribed.  Take Benadryl 25 mg every 6 hours for the next 3 days.  Return to the emergency department if symptoms recur, or you develop other new and concerning symptoms.

## 2020-06-19 ENCOUNTER — Ambulatory Visit: Payer: Self-pay | Attending: Internal Medicine | Admitting: Internal Medicine

## 2020-06-19 ENCOUNTER — Other Ambulatory Visit: Payer: Self-pay | Admitting: Internal Medicine

## 2020-06-19 ENCOUNTER — Encounter: Payer: Self-pay | Admitting: Internal Medicine

## 2020-06-19 ENCOUNTER — Other Ambulatory Visit: Payer: Self-pay

## 2020-06-19 DIAGNOSIS — T783XXD Angioneurotic edema, subsequent encounter: Secondary | ICD-10-CM

## 2020-06-19 DIAGNOSIS — F419 Anxiety disorder, unspecified: Secondary | ICD-10-CM

## 2020-06-19 MED ORDER — EPINEPHRINE 0.3 MG/0.3ML IJ SOAJ: 0.3000 mg | INTRAMUSCULAR | 0 refills | Status: DC | PRN

## 2020-06-19 NOTE — Progress Notes (Signed)
Virtual Visit via Telephone Note Due to current restrictions/limitations of in-office visits due to the COVID-19 pandemic, this scheduled clinical appointment was converted to a telehealth visit  I connected with Jessica Gordon on 06/19/20 at 2:59 p.m by telephone and verified that I am speaking with the correct person using two identifiers. I am in my office.  The patient is at home.  Only the patient and myself participated in this encounter.  I discussed the limitations, risks, security and privacy concerns of performing an evaluation and management service by telephone and the availability of in person appointments. I also discussed with the patient that there may be a patient responsible charge related to this service. The patient expressed understanding and agreed to proceed.   History of Present Illness: Patient with history of depression, RLS, tobacco dependence, chronic lower back pain.  Patient was last seen 04/16/2020.  Purpose of today's visit is 54-month follow-up.   Pt was getting Risperdone from [facility] for anxiety and depression. Last seen 12/2019.  Requesting RF because [facility] told her they are no longer doing meds.  Out x several days. Just loss her grandmother. Recently seen in ER for angioedema. Not sure the cause and denies any food allergy. UDS was positive for opioids, benzos and amphetamines.  Reports she had taken an Oxycodone from her grandmother for pain.  She tells me that she recently started seeing a psychiatrist who has her on alprazolam for anxiety.  She does not recall the psychiatrist name.   -I did look in the [place] controlled substance reporting system database and found that she has been seeing Dr. Sharl Ma since last year and is on alprazolam and Dextroamp-amphetamin. Request rxn for Epi pen be sent to our pharmacy.  Rxn sent to [facility] by ER physician after she was seen in the emergency room earlier this month with angioedema.  However she states the medication  cost $150 which she cannot afford.  Reports she use to have Epi pen in past because she was told she is allergic to bee stings.   Outpatient Encounter Medications as of 06/19/2020  Medication Sig  . diclofenac Sodium (VOLTAREN) 1 % GEL Apply 2 g topically 4 (four) times daily.  Marland Kitchen DOXEPIN HCL PO Take by mouth at bedtime.  Marland Kitchen EPINEPHrine (EPIPEN 2-PAK) 0.3 mg/0.3 mL IJ SOAJ injection Inject 0.3 mLs (0.3 mg total) into the muscle as needed for anaphylaxis.  Marland Kitchen gabapentin (NEURONTIN) 100 MG capsule Take 100 mg by mouth 3 (three) times daily.  Marland Kitchen lidocaine-prilocaine (EMLA) cream Apply 1 application topically as needed.  . mirtazapine (REMERON) 30 MG tablet Take 30 mg by mouth at bedtime.  . Multiple Vitamin (MULTIVITAMIN) capsule Take 1 capsule by mouth daily.  . naproxen (NAPROSYN) 500 MG tablet Take 1 tablet (500 mg total) by mouth 2 (two) times daily with a meal.  . predniSONE (DELTASONE) 10 MG tablet Take 2 tablets (20 mg total) by mouth 2 (two) times daily with a meal.  . pregabalin (LYRICA) 25 MG capsule Take 1 capsule (25 mg total) by mouth 2 (two) times daily.  Marland Kitchen triamcinolone cream (KENALOG) 0.1 % Apply 1 application topically 2 (two) times daily.   No facility-administered encounter medications on file as of 06/19/2020.    Observations/Objective: Patient's speech sounded a bit slurred.  Assessment and Plan: 1. Anxiety and depression -Advised patient that since she is seeing Dr. Evelene Croon for mental health, I recommend that she speaks with her about putting her on the risperidone.  She states she will do so.  2. Angioedema, subsequent encounter I have resent the prescription for EpiPen to our pharmacy. - EPINEPHrine (EPIPEN 2-PAK) 0.3 mg/0.3 mL IJ SOAJ injection; Inject 0.3 mLs (0.3 mg total) into the muscle as needed for anaphylaxis.  Dispense: 1 each; Refill: 0   Follow Up Instructions: As needed   I discussed the assessment and treatment plan with the patient. The patient was provided  an opportunity to ask questions and all were answered. The patient agreed with the plan and demonstrated an understanding of the instructions.   The patient was advised to call back or seek an in-person evaluation if the symptoms worsen or if the condition fails to improve as anticipated.  I provided 16 minutes of non-face-to-face time during this encounter.   Jonah Blue, MD

## 2020-06-19 NOTE — Progress Notes (Signed)
Hospital f/u  Requested  a pain management referral  Needs an epi pen refill Stated coping with grief at this time

## 2020-08-10 ENCOUNTER — Other Ambulatory Visit: Payer: Self-pay | Admitting: Internal Medicine

## 2020-08-10 DIAGNOSIS — T783XXD Angioneurotic edema, subsequent encounter: Secondary | ICD-10-CM

## 2020-08-10 MED ORDER — EPINEPHRINE 0.3 MG/0.3ML IJ SOAJ: 0.3000 mg | INTRAMUSCULAR | 3 refills | Status: DC | PRN

## 2020-08-16 ENCOUNTER — Other Ambulatory Visit: Payer: Self-pay

## 2020-08-16 DIAGNOSIS — T783XXD Angioneurotic edema, subsequent encounter: Secondary | ICD-10-CM

## 2020-08-16 MED ORDER — EPINEPHRINE 0.3 MG/0.3ML IJ SOAJ: 0.3000 mg | INTRAMUSCULAR | 0 refills | Status: DC | PRN

## 2020-10-30 ENCOUNTER — Other Ambulatory Visit: Payer: Self-pay | Admitting: Pharmacist

## 2020-10-30 DIAGNOSIS — T783XXD Angioneurotic edema, subsequent encounter: Secondary | ICD-10-CM

## 2020-10-30 MED ORDER — EPINEPHRINE 0.3 MG/0.3ML IJ SOAJ: 0.3000 mg | INTRAMUSCULAR | 0 refills | Status: DC | PRN

## 2020-10-30 MED FILL — $Epipen 2-pak: 6 days supply | Qty: 6 | Fill #0

## 2021-02-26 ENCOUNTER — Other Ambulatory Visit: Payer: Self-pay | Admitting: Internal Medicine

## 2021-02-26 DIAGNOSIS — L309 Dermatitis, unspecified: Secondary | ICD-10-CM

## 2021-02-26 DIAGNOSIS — T783XXD Angioneurotic edema, subsequent encounter: Secondary | ICD-10-CM

## 2021-02-26 MED ORDER — LIDOCAINE-PRILOCAINE 2.5-2.5 % EX CREA: 1.0000 "application " | TOPICAL_CREAM | CUTANEOUS | 0 refills | Status: DC | PRN

## 2021-02-26 MED ORDER — EPINEPHRINE 0.3 MG/0.3ML IJ SOAJ: 0.3000 mg | INTRAMUSCULAR | 0 refills | Status: DC | PRN

## 2021-02-26 MED ORDER — TRIAMCINOLONE ACETONIDE 0.1 % EX CREA: 1.0000 "application " | TOPICAL_CREAM | Freq: Two times a day (BID) | CUTANEOUS | 0 refills | Status: DC

## 2021-02-26 NOTE — Telephone Encounter (Signed)
Copied from CRM [id number]. Topic: Quick Communication - Rx Refill/Question >> Feb 26, 2021  3:48 PM Jaquita Rector A wrote: Medication: triamcinolone cream (KENALOG) 0.1  tube and jar %, lidocaine-prilocaine (EMLA) cream, EPINEPHrine (EPIPEN 2-PAK) 0.3 mg/0.3 mL IJ SOAJ injection,   Has the patient contacted their pharmacy? Yes.   (Agent: If no, request that the patient contact the pharmacy for the refill.) (Agent: If yes, when and what did the pharmacy advise?)  Preferred Pharmacy (with phone number or street name): [facility] - [place], [place] - [address]  Phone:  [phone] Fax:  [phone]     Agent: Please be advised that RX refills may take up to 3 business days. We ask that you follow-up with your pharmacy.

## 2021-02-27 MED FILL — LIDOCAINE-PRILOCAINE CREAM: 2.5-2.5 | 7 days supply | Qty: 30 | Fill #0

## 2021-02-27 MED FILL — TRIAMCINOLONE 0.1% CREAM: 0.1 | 7 days supply | Qty: 30 | Fill #0

## 2021-03-23 ENCOUNTER — Other Ambulatory Visit: Payer: Self-pay

## 2021-03-28 ENCOUNTER — Ambulatory Visit: Payer: Self-pay | Admitting: Physician Assistant

## 2021-05-15 ENCOUNTER — Ambulatory Visit: Payer: Self-pay | Admitting: Physician Assistant

## 2021-06-07 IMAGING — CR DG LUMBAR SPINE COMPLETE 4+V
5 series · 5 of 5 positions shown · non-contrast
Comparison: Lumbar spine radiographs 04/06/2017. MRI 02/19/2016.

CLINICAL DATA: Chronic bilateral low back pain with bilateral
sciatica. No acute injury.

EXAM:
LUMBAR SPINE - COMPLETE 4+ VIEW

[l-spine ap]
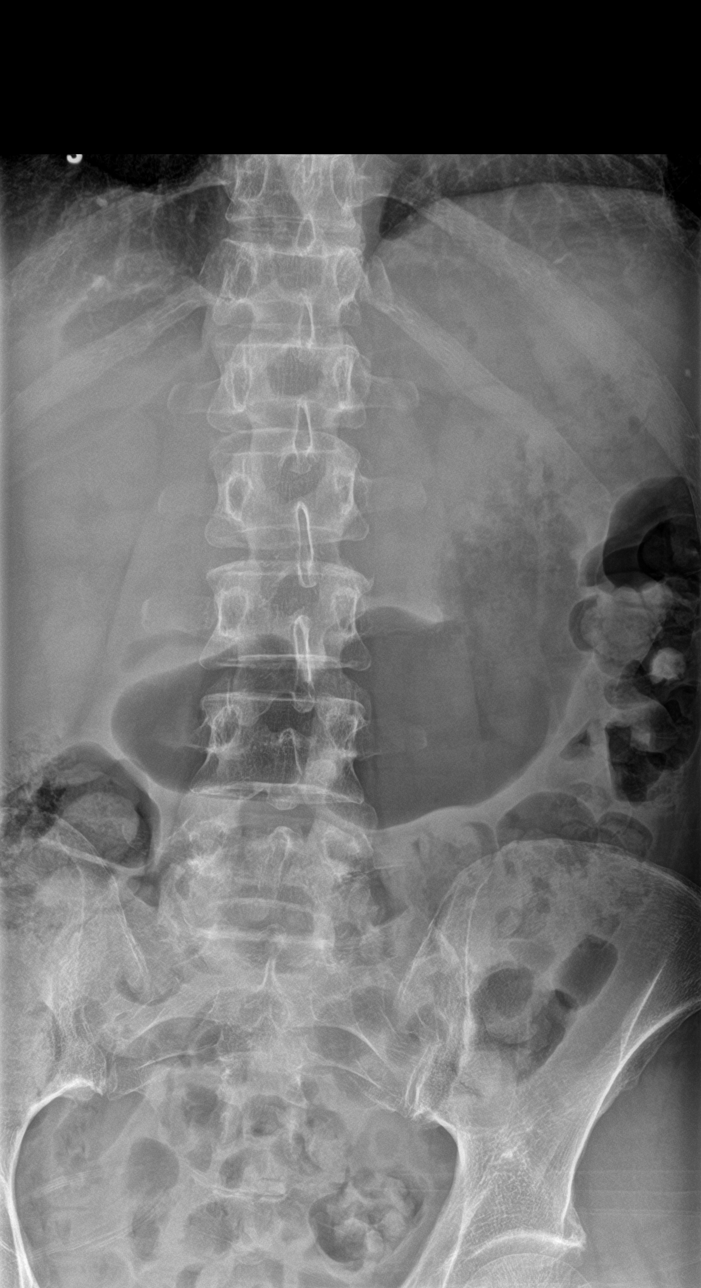

[l-spine obl (1 of 2)]
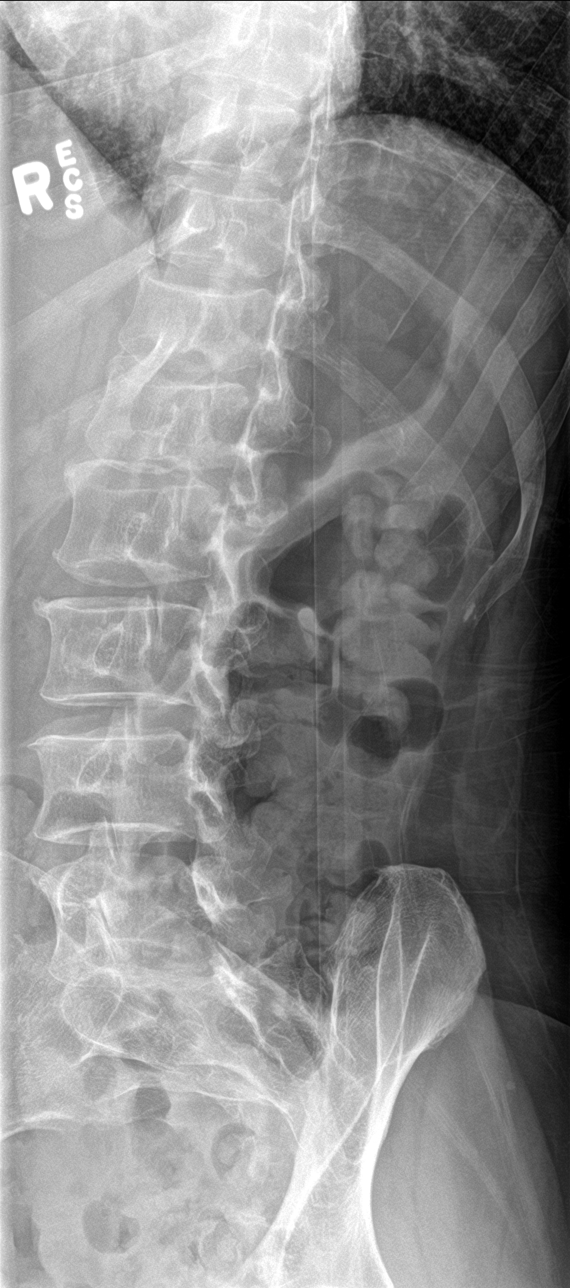

[l-spine obl (2 of 2)]
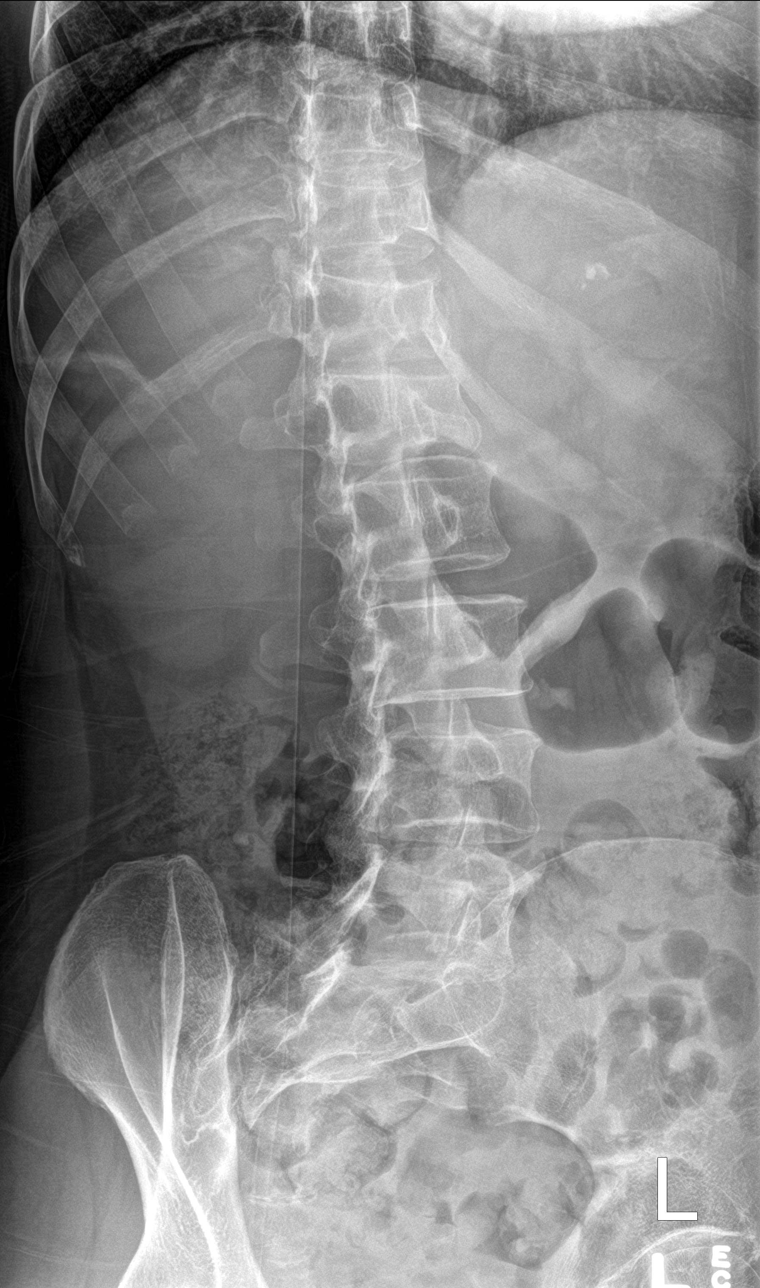

[l-spine lat]
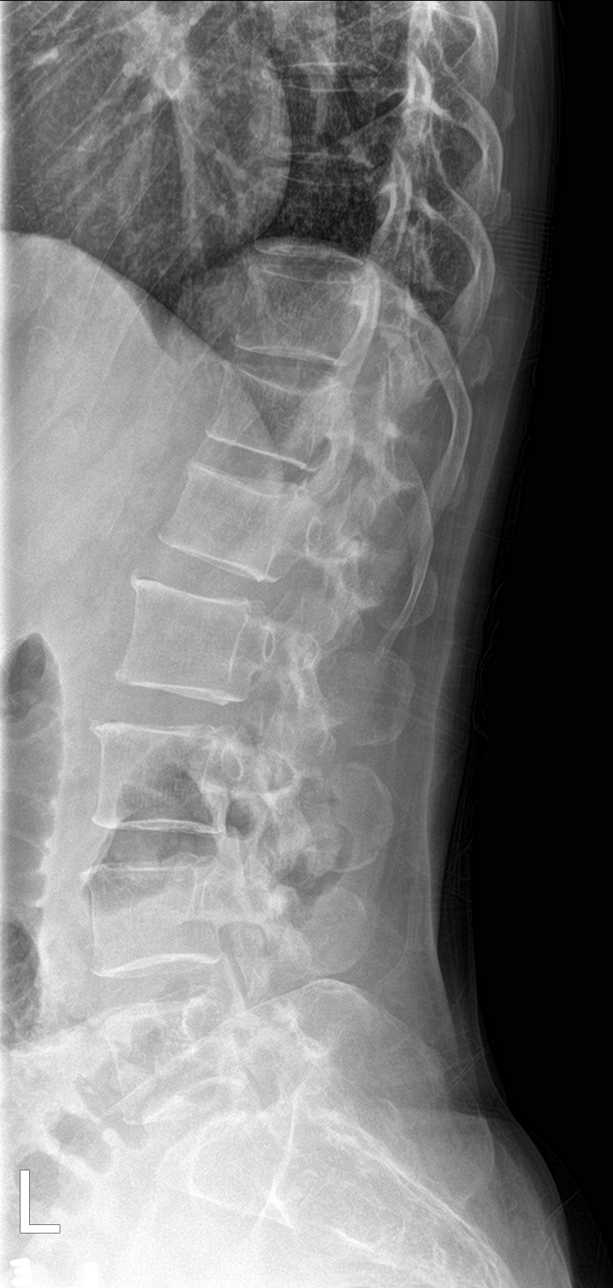

[l-spine spot]
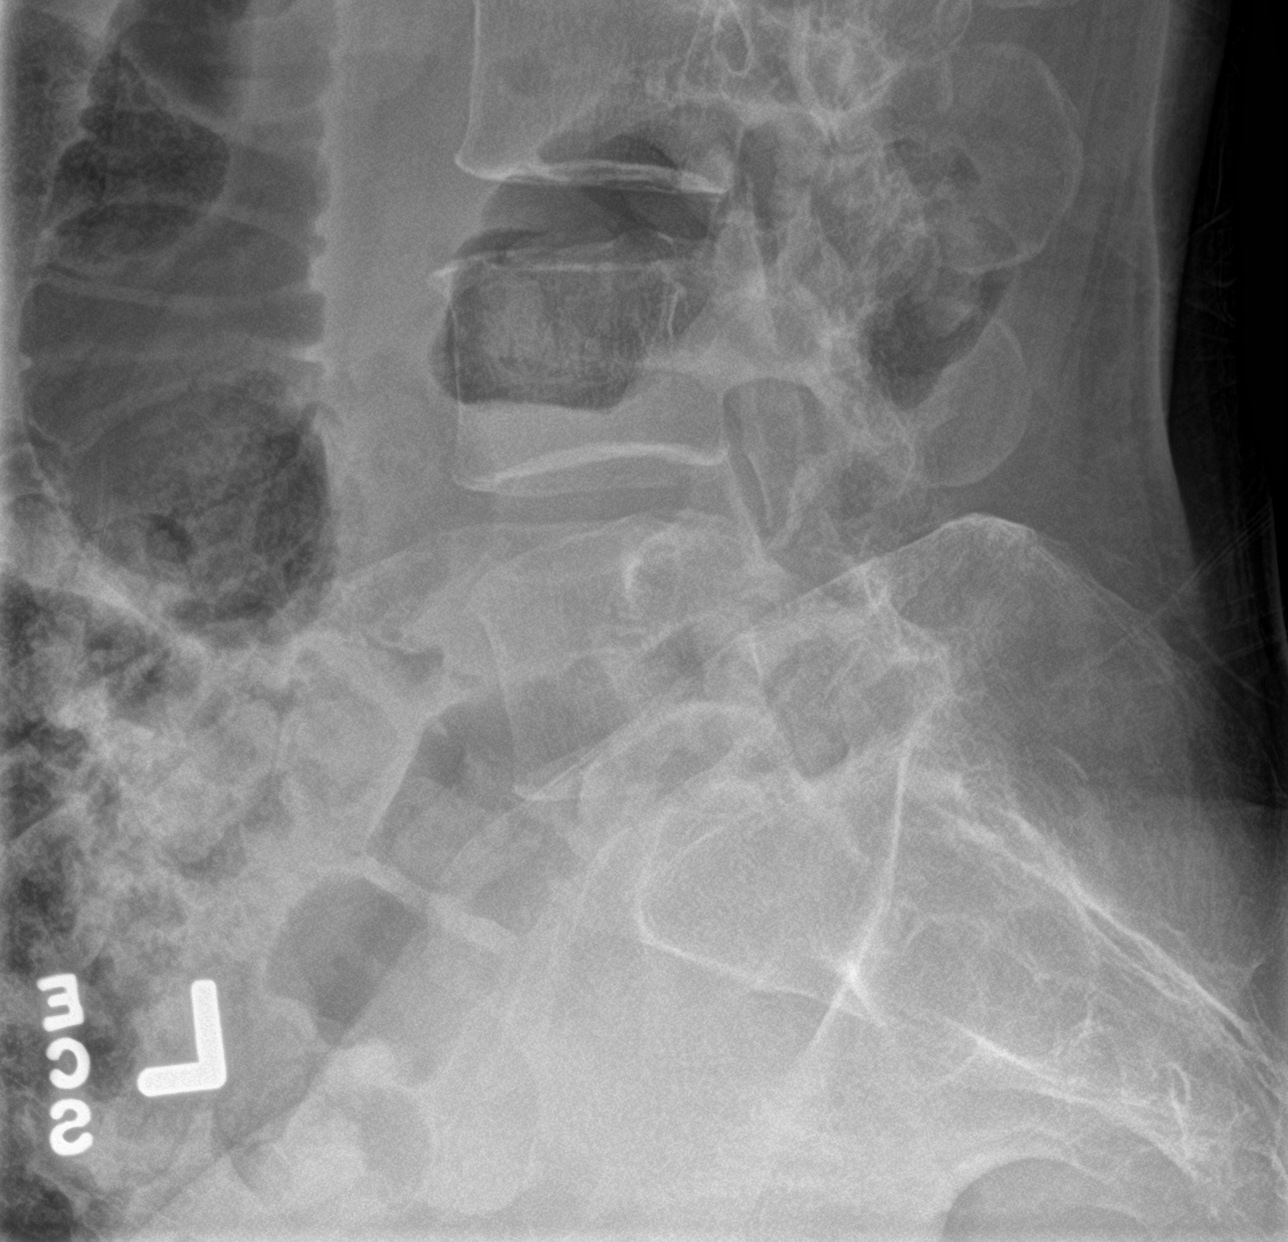

[5 of 5 positions shown; findings below may reference images not displayed]

FINDINGS: There are 5 lumbar type vertebral bodies. The alignment is normal.
There is a mild superior endplate compression deformity at T12,
questionably present on the prior radiographs. No evidence of acute
lumbar spine fracture or pars defect. The lumbar disc spaces are
preserved. Mild facet degenerative changes inferiorly.
IMPRESSION: No acute osseous findings or malalignment. Mild superior endplate
compression deformity at T12, questionably present on prior
radiographs.

## 2021-06-25 ENCOUNTER — Other Ambulatory Visit: Payer: Self-pay

## 2021-06-25 MED FILL — Epinephrine Solution Auto-injector 0.3 MG/0.3ML (1:1000): INTRAMUSCULAR | 1 days supply | Qty: 2 | Fill #0 | Status: CN

## 2021-06-27 ENCOUNTER — Other Ambulatory Visit: Payer: Self-pay

## 2021-06-27 MED FILL — Epinephrine Solution Auto-injector 0.3 MG/0.3ML (1:1000): INTRAMUSCULAR | 30 days supply | Qty: 2 | Fill #0 | Status: CN

## 2021-07-04 ENCOUNTER — Ambulatory Visit: Payer: Self-pay | Admitting: Physician Assistant

## 2021-07-04 ENCOUNTER — Other Ambulatory Visit: Payer: Self-pay

## 2021-07-04 ENCOUNTER — Ambulatory Visit: Payer: Medicaid Other | Admitting: Physician Assistant

## 2021-07-15 ENCOUNTER — Other Ambulatory Visit: Payer: Self-pay

## 2021-07-15 MED FILL — Epinephrine Solution Auto-injector 0.3 MG/0.3ML (1:1000): INTRAMUSCULAR | 2 days supply | Qty: 2 | Fill #0 | Status: AC

## 2021-07-16 ENCOUNTER — Other Ambulatory Visit: Payer: Self-pay

## 2021-08-06 ENCOUNTER — Telehealth: Payer: Self-pay

## 2021-08-06 NOTE — Telephone Encounter (Signed)
Attempted to contact patient regarding referral received from [facility]. Left message on voicemail requesting return call.

## 2021-12-30 ENCOUNTER — Ambulatory Visit: Payer: Self-pay | Admitting: *Deleted
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
## 2021-12-30 NOTE — Telephone Encounter (Signed)
Scheduled appt for patient with Dr. Wynetta Emery.  Aware that apt is virtual/ over the phone. Patient preferred afternoon appt.

## 2021-12-30 NOTE — Telephone Encounter (Signed)
number goes straight to voicemail.  LVM on both contacts.

## 2021-12-31 ENCOUNTER — Ambulatory Visit: Payer: Medicaid Other | Attending: Internal Medicine | Admitting: Internal Medicine

## 2022-01-09 ENCOUNTER — Other Ambulatory Visit: Payer: Self-pay

## 2022-01-09 ENCOUNTER — Ambulatory Visit: Payer: Self-pay | Attending: Internal Medicine | Admitting: Internal Medicine

## 2022-01-09 DIAGNOSIS — Z87898 Personal history of other specified conditions: Secondary | ICD-10-CM

## 2022-01-09 DIAGNOSIS — F419 Anxiety disorder, unspecified: Secondary | ICD-10-CM

## 2022-01-09 DIAGNOSIS — T783XXD Angioneurotic edema, subsequent encounter: Secondary | ICD-10-CM

## 2022-01-09 DIAGNOSIS — R52 Pain, unspecified: Secondary | ICD-10-CM

## 2022-01-09 DIAGNOSIS — M545 Low back pain, unspecified: Secondary | ICD-10-CM

## 2022-01-09 DIAGNOSIS — F32A Depression, unspecified: Secondary | ICD-10-CM

## 2022-01-09 DIAGNOSIS — G8929 Other chronic pain: Secondary | ICD-10-CM

## 2022-01-09 DIAGNOSIS — L309 Dermatitis, unspecified: Secondary | ICD-10-CM
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
## 2022-01-10 ENCOUNTER — Other Ambulatory Visit: Payer: Self-pay

## 2022-01-10 ENCOUNTER — Other Ambulatory Visit: Payer: Self-pay | Admitting: Internal Medicine

## 2022-01-10 DIAGNOSIS — T783XXD Angioneurotic edema, subsequent encounter: Secondary | ICD-10-CM

## 2022-01-10 MED ORDER — EPINEPHRINE 0.3 MG/0.3ML IJ SOAJ
INTRAMUSCULAR | 1 refills | Status: AC
  Filled 2022-01-10: qty 2, 28d supply, fill #0
  Filled 2022-05-26: qty 2, 30d supply, fill #0

## 2022-01-10 NOTE — Telephone Encounter (Signed)
Requested Prescriptions  Pending Prescriptions Disp Refills   EPINEPHrine (EPIPEN 2-PAK) 0.3 mg/0.3 mL IJ SOAJ injection 2 each 0    Sig: INJECT 0.3 MG INTO THE MUSCLE AS NEEDED FOR ANAPHYLAXIS.     Immunology: Antidotes Passed - 01/10/2022 11:55 AM      Passed - Valid encounter within last 12 months    Recent Outpatient Visits          Yesterday Anxiety and depression   [facility] Marcine Matar, MD   1 year ago Anxiety and depression   [facility] Marcine Matar, MD   1 year ago Annual physical exam   [facility] Marcine Matar, MD   1 year ago Encounter to establish care   [facility] [place], NP   1 year ago Appointment canceled by hospital   [facility] Marcine Matar, MD

## 2022-01-11 MED ORDER — DULOXETINE HCL 20 MG PO CPEP: 20.0000 mg | ORAL_CAPSULE | Freq: Every day | ORAL | 1 refills | Status: AC

## 2022-01-11 MED ORDER — LIDOCAINE-PRILOCAINE 2.5-2.5 % EX CREA: TOPICAL_CREAM | CUTANEOUS | 0 refills | Status: AC

## 2022-01-11 MED ORDER — MELOXICAM 15 MG PO TABS: 15.0000 mg | ORAL_TABLET | Freq: Every day | ORAL | 1 refills | Status: AC

## 2022-01-11 MED ORDER — EPINEPHRINE 0.3 MG/0.3ML IJ SOAJ: INTRAMUSCULAR | 0 refills | Status: AC

## 2022-01-15 NOTE — Addendum Note (Signed)
Addended by: Karle Plumber B on: 01/15/2022 10:05 AM   Modules accepted: Orders

## 2022-01-16 ENCOUNTER — Other Ambulatory Visit: Payer: Self-pay

## 2022-01-17 ENCOUNTER — Other Ambulatory Visit: Payer: Self-pay

## 2022-01-21 ENCOUNTER — Other Ambulatory Visit: Payer: Self-pay

## 2022-01-21 DIAGNOSIS — N6320 Unspecified lump in the left breast, unspecified quadrant: Secondary | ICD-10-CM

## 2022-01-24 ENCOUNTER — Telehealth: Payer: Self-pay | Admitting: Clinical

## 2022-01-24 NOTE — Telephone Encounter (Signed)
I spoke with pt and scheduled an appt with her for 02/17/22. Pt has family planning medicaid. I provided the walk-in hours for [facility] and encouraged pt to go.   "This pt has been seeing psychiatrist Dr. Evelene Croon.  She has depression, anxiety,PTSD and likely ADHD.  She is on Xanax and Adderall through Dr. Evelene Croon.  She now has Medicaid which she said Dr. Evelene Croon does not accept.  She is wanting to be referred to [facility]/psychiatrist who accepts Medicaid.  Please assist".

## 2022-02-13 ENCOUNTER — Other Ambulatory Visit: Payer: Self-pay

## 2022-02-13 ENCOUNTER — Other Ambulatory Visit: Payer: Medicaid Other

## 2022-02-13 ENCOUNTER — Ambulatory Visit: Payer: Medicaid Other

## 2022-02-17 ENCOUNTER — Institutional Professional Consult (permissible substitution): Payer: Medicaid Other | Admitting: Clinical

## 2022-02-19 ENCOUNTER — Ambulatory Visit: Payer: Self-pay | Attending: Internal Medicine | Admitting: Clinical

## 2022-02-19 DIAGNOSIS — F331 Major depressive disorder, recurrent, moderate: Secondary | ICD-10-CM

## 2022-02-19 DIAGNOSIS — F411 Generalized anxiety disorder: Secondary | ICD-10-CM
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
## 2022-03-10 ENCOUNTER — Other Ambulatory Visit: Payer: Self-pay

## 2022-03-11 ENCOUNTER — Inpatient Hospital Stay: Admission: RE | Admit: 2022-03-11 | Payer: Medicaid Other | Source: Ambulatory Visit

## 2022-03-11 ENCOUNTER — Ambulatory Visit: Payer: Medicaid Other

## 2022-03-11 ENCOUNTER — Other Ambulatory Visit: Payer: Medicaid Other

## 2022-03-12 ENCOUNTER — Ambulatory Visit: Payer: Medicaid Other | Admitting: Clinical

## 2022-04-07 ENCOUNTER — Telehealth: Payer: Self-pay | Admitting: Internal Medicine

## 2022-04-07 ENCOUNTER — Ambulatory Visit: Payer: Medicaid Other | Admitting: Clinical

## 2022-04-07 NOTE — Telephone Encounter (Signed)
Copied from CRM [id number]. Topic: Appointment Scheduling - Scheduling Inquiry for Clinic ?>> Apr 07, 2022  2:19 PM Pawlus, Maxine Glenn A wrote: ?Reason for CRM: Pt missed her appt today at 11am 4/17 with MCCOY, ASANTE C, pt wanted to reschedule, please advise. ?

## 2022-04-18 ENCOUNTER — Encounter: Payer: Self-pay | Admitting: Clinical

## 2022-04-18 NOTE — Telephone Encounter (Signed)
I attempted to call pt, no answer, left vm. I will send pt resources in the mail.  ?

## 2022-04-18 NOTE — Progress Notes (Signed)
NA

## 2022-05-23 ENCOUNTER — Ambulatory Visit (HOSPITAL_COMMUNITY)
Admission: EM | Admit: 2022-05-23 | Discharge: 2022-05-23 | Disposition: A | Discharge status: Home / Self Care | Payer: No Payment, Other | Attending: Psychiatry | Admitting: Psychiatry

## 2022-05-23 DIAGNOSIS — F4322 Adjustment disorder with anxiety: Secondary | ICD-10-CM | POA: Insufficient documentation

## 2022-05-23 NOTE — Discharge Instructions (Addendum)
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
## 2022-05-23 NOTE — ED Provider Notes (Signed)
Behavioral Health Urgent Care Medical Screening Exam  Patient Name: Jessica Gordon MRN: [id number] Date of Evaluation: 05/23/22 Chief Complaint:   Diagnosis:  Final diagnoses:  Adjustment disorder with anxious mood    History of Present illness: Jessica Gordon is a 46 y.o. female. Patient presents voluntarily to [facility] behavioral health for walk-in assessment.  Patient is accompanied by her "best friend" who does not remain present during assessment.   Jessica Gordon presents today because she "misplaced her anxiety medication."  Patient has been diagnosed with major depressive disorder, generalized anxiety disorder and ADHD.  She is followed outpatient by Dr. Milagros Evener.  She reports she is prescribed alprazolam to treat her anxiety, she misplaced this medication approximately 4 days ago.  She is compliant with medication to treat her ADHD, Adderall.  She is followed by an outpatient counselor, unable to recall counselor at this time.  She has an upcoming appointment at [facility] behavioral health outpatient on 05/26/2022 for additional medication management.  Recent stressors include patient is primary caregiver for her uncle who has been diagnosed with Parkinson's disease and has recently been seen in the emergency department.  She reports some decreased sleep and increased anxiety for approximately 1 week.  Patient is assessed face-to-face by nurse practitioner.  She is seated in assessment area, no acute distress.  She is alert and oriented, pleasant and cooperative during assessment.   She presents with anxious mood, congruent affect. She denies suicidal and homicidal ideations.  She denies history of suicide attempts, denies history of nonsuicidal self-harm behavior.  She easily contracts verbally for safety with this Clinical research associate.  She has normal speech and behavior.  She denies auditory and visual hallucinations.  Patient is able to converse coherently with goal-directed thoughts and no  distractibility or preoccupation.  She denies paranoia.  Objectively there is no evidence of psychosis/mania or delusional thinking.  [name] resides with her best friend in [place].  She denies access to weapons.  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  She denies alcohol and substance use.  Patient endorses decreased sleep and average appetite.  Patient offered support and encouragement. She verbalizes plan to follow up with established outpatient psychiatry medication management provider. Reviewed potential for withdrawal form benzodiazepine medications when stopped abruptly, patient verbalizes understanding.   Patient is educated and verbalizes understanding of mental health resources and other crisis services in the community. She is instructed to call 911 and present to the nearest emergency room should she experience any suicidal/homicidal ideation, auditory/visual/hallucinations, or detrimental worsening of her mental health condition.     Psychiatric Specialty Exam  Presentation  General Appearance:Appropriate for Environment; Casual  Eye Contact:Good  Speech:Clear and Coherent; Normal Rate  Speech Volume:Normal  Handedness:Right   Mood and Affect  Mood:Anxious  Affect:Appropriate; Congruent   Thought Process  Thought Processes:Coherent; Goal Directed; Linear  Descriptions of Associations:Intact  Orientation:Full (Time, Place and Person)  Thought Content:Logical    Hallucinations:None  Ideas of Reference:None  Suicidal Thoughts:No  Homicidal Thoughts:No   Sensorium  Memory:Immediate Good; Recent Good  Judgment:Fair  Insight:Fair   Executive Functions  Concentration:Good  Attention Span:Good  Recall:Good  Fund of Knowledge:Good  Language:Good   Psychomotor Activity  Psychomotor Activity:Normal   Assets  Assets:Communication Skills; Desire for Improvement; Financial Resources/Insurance; Housing; Intimacy; Leisure Time;  Resilience; Physical Health; Social Support   Sleep  Sleep:Poor  Number of hours: No data recorded  No data recorded  Physical Exam: Physical Exam Vitals and nursing note  reviewed.  Constitutional:      Appearance: Normal appearance. She is well-developed and normal weight.  HENT:     Head: Normocephalic and atraumatic.     Nose: Nose normal.  Cardiovascular:     Rate and Rhythm: Normal rate.  Pulmonary:     Effort: Pulmonary effort is normal.  Musculoskeletal:        General: Normal range of motion.     Cervical back: Normal range of motion.  Skin:    General: Skin is warm and dry.  Neurological:     Mental Status: She is alert and oriented to person, place, and time.  Psychiatric:        Attention and Perception: Attention and perception normal.        Mood and Affect: Affect normal. Mood is anxious.        Speech: Speech normal.        Behavior: Behavior normal. Behavior is cooperative.        Thought Content: Thought content normal.        Cognition and Memory: Cognition and memory normal.   Review of Systems  Constitutional: Negative.   HENT: Negative.    Eyes: Negative.   Respiratory: Negative.    Cardiovascular: Negative.   Gastrointestinal: Negative.   Genitourinary: Negative.   Musculoskeletal: Negative.   Skin: Negative.   Neurological: Negative.   Endo/Heme/Allergies: Negative.   Psychiatric/Behavioral:  The patient is nervous/anxious.   Blood pressure 121/86, pulse 93, resp. rate 18, SpO2 95 %. There is no height or weight on file to calculate BMI.  Musculoskeletal: Strength & Muscle Tone: within normal limits Gait & Station: normal Patient leans: N/A   [facility] MSE Discharge Disposition for Follow up and Recommendations: Based on my evaluation the patient does not appear to have an emergency medical condition and can be discharged with resources and follow up care in outpatient services for Medication Management and Individual Therapy Patient  reviewed with Dr Bronwen Betters. Follow up with established outpatient psychiatry provider.  Continue current medications.    Lenard Lance, FNP 05/23/2022, 1:18 PM

## 2022-05-26 ENCOUNTER — Other Ambulatory Visit: Payer: Self-pay

## 2022-05-30 ENCOUNTER — Other Ambulatory Visit: Payer: Self-pay

## 2022-06-09 ENCOUNTER — Other Ambulatory Visit: Payer: Self-pay

## 2022-06-10 ENCOUNTER — Telehealth (HOSPITAL_COMMUNITY): Payer: Self-pay | Admitting: Internal Medicine

## 2022-06-10 NOTE — BH Assessment (Signed)
Care Management - [facility]UC Follow Up Discharges   Writer attempted to make contact with patient today and was unsuccessful.  Phone number is disconnected.    Per chart review, pt had a follow up appt on 05/26/2022 at [facility] for med mgt

## 2022-09-03 ENCOUNTER — Telehealth: Payer: Self-pay

## 2022-09-03 NOTE — Telephone Encounter (Signed)
Telephoned patient at [phone] (no voice mail), [phone] left voice message with [facility] contact information.Patient needs to call and reschedule appointment.

## 2022-10-20 NOTE — Patient Instructions (Incomplete)
Thank you for attending your appointment today.  -- *** -- Continue other medications as prescribed.  Please do not make any changes to medications without first discussing with your provider. If you are experiencing a psychiatric emergency, please call 911 or present to your nearest emergency department. Additional crisis, medication management, and therapy resources are included below.  [facility]  [address], [place], [place] [address] [phone] WALK-IN URGENT CARE 24/7 FOR ANYONE [address], [place], [place]  [phone] Fax: [phone] [email or web address] *Interpreters available *Accepts all insurance and uninsured for Urgent Care needs *Accepts Medicaid and uninsured for outpatient treatment (below)      ONLY FOR [place] Residents  Below:    Outpatient New Patient Assessment/Therapy Walk-ins:        Monday -Thursday 8am until slots are full.        Every Friday 1pm-4pm  (first come, first served)                   New Patient Psychiatry/Medication Management        Monday-Friday 8am-11am (first come, first served)               For all walk-ins we ask that you arrive by 7:15am, because patients will be seen in the order of arrival.   
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
## 2022-10-21 ENCOUNTER — Ambulatory Visit (HOSPITAL_COMMUNITY): Payer: No Payment, Other | Admitting: Psychiatry

## 2022-10-31 ENCOUNTER — Ambulatory Visit (HOSPITAL_COMMUNITY): Payer: No Payment, Other | Admitting: Psychiatry

## 2023-04-08 ENCOUNTER — Telehealth: Payer: Self-pay | Admitting: Clinical

## 2023-04-08 NOTE — Telephone Encounter (Signed)
Copied from CRM [id number]. Topic: Appointment Scheduling - Scheduling Inquiry for Clinic >> Apr 08, 2023  2:07 PM Dondra Prader A wrote: Reason for CRM: Pt missed her appt today 04/08/23 for integrated BH Follow up and is wanting to see if she can please be rescheduled as soon as possible. Please call pt back.

## 2023-05-12 ENCOUNTER — Emergency Department (HOSPITAL_COMMUNITY)
Admission: EM | Admit: 2023-05-12 | Discharge: 2023-05-12 | Discharge status: Against Medical Advice | Payer: Medicaid Other | Attending: Emergency Medicine | Admitting: Emergency Medicine

## 2023-05-12 ENCOUNTER — Encounter (HOSPITAL_COMMUNITY): Payer: Self-pay

## 2023-05-12 ENCOUNTER — Other Ambulatory Visit: Payer: Self-pay

## 2023-05-12 DIAGNOSIS — Z5329 Procedure and treatment not carried out because of patient's decision for other reasons: Secondary | ICD-10-CM | POA: Insufficient documentation

## 2023-05-12 DIAGNOSIS — N390 Urinary tract infection, site not specified: Secondary | ICD-10-CM | POA: Insufficient documentation

## 2023-05-12 DIAGNOSIS — T50901A Poisoning by unspecified drugs, medicaments and biological substances, accidental (unintentional), initial encounter: Secondary | ICD-10-CM | POA: Insufficient documentation

## 2023-05-12 DIAGNOSIS — F430 Acute stress reaction: Secondary | ICD-10-CM | POA: Diagnosis not present

## 2023-05-12 DIAGNOSIS — F419 Anxiety disorder, unspecified: Secondary | ICD-10-CM | POA: Insufficient documentation

## 2023-05-12 DIAGNOSIS — E876 Hypokalemia: Secondary | ICD-10-CM | POA: Insufficient documentation

## 2023-05-12 DIAGNOSIS — R4589 Other symptoms and signs involving emotional state: Secondary | ICD-10-CM

## 2023-05-12 LAB — SALICYLATE LEVEL: Salicylate Lvl: <7 mg/dL — ABNORMAL LOW (ref 7.0–30.0)

## 2023-05-12 LAB — URINALYSIS, ROUTINE W REFLEX MICROSCOPIC
Bacteria, UA: NONE SEEN
Bilirubin Urine: NEGATIVE
Glucose, UA: NEGATIVE mg/dL
Ketones, ur: NEGATIVE mg/dL
Nitrite: NEGATIVE
Protein, ur: 30 mg/dL — AB
Specific Gravity, Urine: 1.028 (ref 1.005–1.030)
WBC, UA: >50 WBC/hpf (ref 0–5)
pH: 5 (ref 5.0–8.0)

## 2023-05-12 LAB — COMPREHENSIVE METABOLIC PANEL
ALT: 19 U/L (ref 0–44)
AST: 24 U/L (ref 15–41)
Albumin: 4 g/dL (ref 3.5–5.0)
Alkaline Phosphatase: 56 U/L (ref 38–126)
Anion gap: 9 (ref 5–15)
BUN: 23 mg/dL — ABNORMAL HIGH (ref 6–20)
CO2: 27 mmol/L (ref 22–32)
Calcium: 9 mg/dL (ref 8.9–10.3)
Chloride: 102 mmol/L (ref 98–111)
Creatinine, Ser: 0.79 mg/dL (ref 0.44–1.00)
GFR, Estimated: >60 mL/min (ref >60)
Glucose, Bld: 67 mg/dL — ABNORMAL LOW (ref 70–99)
Potassium: 3.1 mmol/L — ABNORMAL LOW (ref 3.5–5.1)
Sodium: 138 mmol/L (ref 135–145)
Total Bilirubin: 0.8 mg/dL (ref 0.3–1.2)
Total Protein: 7.3 g/dL (ref 6.5–8.1)

## 2023-05-12 LAB — ACETAMINOPHEN LEVEL: Acetaminophen (Tylenol), Serum: <10 ug/mL — ABNORMAL LOW (ref 10–30)

## 2023-05-12 LAB — ETHANOL: Alcohol, Ethyl (B): <10 mg/dL (ref <10)

## 2023-05-12 LAB — RAPID URINE DRUG SCREEN, HOSP PERFORMED
Amphetamines: POSITIVE — AB
Barbiturates: NOT DETECTED
Benzodiazepines: POSITIVE — AB
Cocaine: NOT DETECTED
Opiates: POSITIVE — AB
Tetrahydrocannabinol: NOT DETECTED

## 2023-05-12 LAB — PREGNANCY, URINE: Preg Test, Ur: NEGATIVE

## 2023-05-12 LAB — CBC
HCT: 35.5 % — ABNORMAL LOW (ref 36.0–46.0)
Hemoglobin: 12.2 g/dL (ref 12.0–15.0)
MCH: 33.5 pg (ref 26.0–34.0)
MCHC: 34.4 g/dL (ref 30.0–36.0)
MCV: 97.5 fL (ref 80.0–100.0)
Platelets: 232 10*3/uL (ref 150–400)
RBC: 3.64 MIL/uL — ABNORMAL LOW (ref 3.87–5.11)
RDW: 12.4 % (ref 11.5–15.5)
WBC: 7.4 10*3/uL (ref 4.0–10.5)
nRBC: 0 % (ref 0.0–0.2)

## 2023-05-12 MED ORDER — POTASSIUM CHLORIDE CRYS ER 20 MEQ PO TBCR
40.0000 meq | EXTENDED_RELEASE_TABLET | Freq: Once | ORAL | Status: AC
  Administered 2023-05-12: 40 meq via ORAL
  Filled 2023-05-12: qty 2

## 2023-05-12 MED ORDER — SODIUM CHLORIDE 0.9 % IV SOLN
1.0000 g | Freq: Once | INTRAVENOUS | Status: AC
  Administered 2023-05-12: 1 g via INTRAVENOUS
  Filled 2023-05-12: qty 10

## 2023-05-12 MED ORDER — CEPHALEXIN 500 MG PO CAPS: 500.0000 mg | ORAL_CAPSULE | Freq: Four times a day (QID) | ORAL | Status: DC

## 2023-05-12 NOTE — ED Notes (Signed)
Pt has received a sandwich and a drink

## 2023-05-12 NOTE — ED Notes (Signed)
Pt eloped with her boyfriend, walked down the hall to the exit, provider made aware.

## 2023-05-12 NOTE — ED Notes (Signed)
Pt drinking fluids.

## 2023-05-12 NOTE — ED Provider Notes (Signed)
[place] EMERGENCY DEPARTMENT AT [facility] Provider Note   CSN: [id number] Arrival date & time: 05/12/23  1616     History {Add pertinent medical, surgical, social history, OB history to HPI:1} Chief Complaint  Patient presents with   Drug Overdose    Jessica Gordon is a 47 y.o. female.  Patient was found in her car by bystanders, altered/drowsy/not responsive, called EMS. Note made of crushed pill(s), and patient indicated she may have snorted a crushed blue pill, possibly xanax. Pt limited historian - level 5 caveat. Pt denies trying to harm self. Denies other ingestion. EMS gave narcan with limited response.   The history is provided by the patient, medical records and the EMS personnel. The history is limited by the condition of the patient.  Drug Overdose Pertinent negatives include no chest pain, no abdominal pain, no headaches and no shortness of breath.       Home Medications Prior to Admission medications   Medication Sig Start Date End Date Taking? Authorizing Provider  ALPRAZolam Prudy Feeler) 1 MG tablet Take 1 mg by mouth 4 (four) times daily as needed. 08/05/21   [provider]  amphetamine-dextroamphetamine (ADDERALL) 20 MG tablet Take 20 mg by mouth 3 (three) times daily. 12/27/21   [provider]  DULoxetine (CYMBALTA) 20 MG capsule Take 1 capsule (20 mg total) by mouth daily. 01/11/22   Marcine Matar, MD  meloxicam (MOBIC) 15 MG tablet Take 1 tablet (15 mg total) by mouth daily. 01/11/22   Marcine Matar, MD  Multiple Vitamin (MULTIVITAMIN) capsule Take 1 capsule by mouth daily.    [provider]      Allergies    Patient has no known allergies.    Review of Systems   Review of Systems  Constitutional:  Negative for fever.  Respiratory:  Negative for shortness of breath.   Cardiovascular:  Negative for chest pain.  Gastrointestinal:  Negative for abdominal pain and vomiting.  Neurological:  Negative for  headaches.    Physical Exam Updated Vital Signs BP 105/81   Pulse 75   Temp 97.7 F (36.5 C)   Resp 20   Ht 1.676 m (5\' 6" )   Wt 58.3 kg   SpO2 99%   BMI 20.76 kg/m  Physical Exam Vitals and nursing note reviewed.  Constitutional:      Appearance: Normal appearance. She is well-developed.  HENT:     Head: Atraumatic.     Nose: Nose normal.     Mouth/Throat:     Mouth: Mucous membranes are moist.  Eyes:     General: No scleral icterus.    Extraocular Movements: Extraocular movements intact.     Conjunctiva/sclera: Conjunctivae normal.     Pupils: Pupils are equal, round, and reactive to light.  Neck:     Trachea: No tracheal deviation.     Comments: No stiffness or rigidity.  Cardiovascular:     Rate and Rhythm: Normal rate and regular rhythm.     Pulses: Normal pulses.     Heart sounds: Normal heart sounds. No murmur heard.    No friction rub. No gallop.  Pulmonary:     Effort: Pulmonary effort is normal. No respiratory distress.     Breath sounds: Normal breath sounds.  Abdominal:     General: Bowel sounds are normal. There is no distension.     Palpations: Abdomen is soft.     Tenderness: There is no abdominal tenderness.  Genitourinary:  Comments: No cva tenderness.  Musculoskeletal:        General: No swelling or tenderness.     Cervical back: Normal range of motion and neck supple. No rigidity. No muscular tenderness.     Right lower leg: No edema.     Left lower leg: No edema.  Skin:    General: Skin is warm and dry.     Findings: No rash.  Neurological:     Mental Status: She is alert.     Comments: Alert, limited responsiveness, occasionally answers with yes/no or very limited response. Moves bilateral extremities purposefully.   Psychiatric:     Comments: Altered.      ED Results / Procedures / Treatments   Labs (all labs ordered are listed, but only abnormal results are displayed) Results for orders placed or performed during the hospital  encounter of 05/12/23  CBC  Result Value Ref Range   WBC 7.4 4.0 - 10.5 K/uL   RBC 3.64 (L) 3.87 - 5.11 MIL/uL   Hemoglobin 12.2 12.0 - 15.0 g/dL   HCT 16.1 (L) 09.6 - 04.5 %   MCV 97.5 80.0 - 100.0 fL   MCH 33.5 26.0 - 34.0 pg   MCHC 34.4 30.0 - 36.0 g/dL   RDW 40.9 81.1 - 91.4 %   Platelets 232 150 - 400 K/uL   nRBC 0.0 0.0 - 0.2 %  Comprehensive metabolic panel  Result Value Ref Range   Sodium 138 135 - 145 mmol/L   Potassium 3.1 (L) 3.5 - 5.1 mmol/L   Chloride 102 98 - 111 mmol/L   CO2 27 22 - 32 mmol/L   Glucose, Bld 67 (L) 70 - 99 mg/dL   BUN 23 (H) 6 - 20 mg/dL   Creatinine, Ser 7.82 0.44 - 1.00 mg/dL   Calcium 9.0 8.9 - 95.6 mg/dL   Total Protein 7.3 6.5 - 8.1 g/dL   Albumin 4.0 3.5 - 5.0 g/dL   AST 24 15 - 41 U/L   ALT 19 0 - 44 U/L   Alkaline Phosphatase 56 38 - 126 U/L   Total Bilirubin 0.8 0.3 - 1.2 mg/dL   GFR, Estimated >21 >30 mL/min   Anion gap 9 5 - 15  Urinalysis, Routine w reflex microscopic -Urine, Clean Catch  Result Value Ref Range   Color, Urine YELLOW YELLOW   APPearance HAZY (A) CLEAR   Specific Gravity, Urine 1.028 1.005 - 1.030   pH 5.0 5.0 - 8.0   Glucose, UA NEGATIVE NEGATIVE mg/dL   Hgb urine dipstick SMALL (A) NEGATIVE   Bilirubin Urine NEGATIVE NEGATIVE   Ketones, ur NEGATIVE NEGATIVE mg/dL   Protein, ur 30 (A) NEGATIVE mg/dL   Nitrite NEGATIVE NEGATIVE   Leukocytes,Ua LARGE (A) NEGATIVE   RBC / HPF 6-10 0 - 5 RBC/hpf   WBC, UA >50 0 - 5 WBC/hpf   Bacteria, UA NONE SEEN NONE SEEN   Squamous Epithelial / HPF 0-5 0 - 5 /HPF   Mucus PRESENT   Rapid urine drug screen (hospital performed)  Result Value Ref Range   Opiates POSITIVE (A) NONE DETECTED   Cocaine NONE DETECTED NONE DETECTED   Benzodiazepines POSITIVE (A) NONE DETECTED   Amphetamines POSITIVE (A) NONE DETECTED   Tetrahydrocannabinol NONE DETECTED NONE DETECTED   Barbiturates NONE DETECTED NONE DETECTED  Ethanol  Result Value Ref Range   Alcohol, Ethyl (B) <10 <10 mg/dL   Acetaminophen level  Result Value Ref Range   Acetaminophen (Tylenol), Serum <10 (L)  10 - 30 ug/mL  Salicylate level  Result Value Ref Range   Salicylate Lvl <7.0 (L) 7.0 - 30.0 mg/dL  Pregnancy, urine  Result Value Ref Range   Preg Test, Ur NEGATIVE NEGATIVE     EKG EKG Interpretation  Date/Time:  Tuesday May 12 2023 16:27:25 EDT Ventricular Rate:  61 PR Interval:  111 QRS Duration: 108 QT Interval:  426 QTC Calculation: 430 R Axis:   62 Text Interpretation: Sinus rhythm Borderline short PR interval Borderline T wave abnormalities Confirmed by Ernie Avena ([id number]) on 05/12/2023 4:31:21 PM  Radiology No results found.  Procedures Procedures  {Document cardiac monitor, telemetry assessment procedure when appropriate:1}  Medications Ordered in ED Medications  cefTRIAXone (ROCEPHIN) 1 g in sodium chloride 0.9 % 100 mL IVPB (1 g Intravenous New Bag/Given 05/12/23 1824)  potassium chloride SA (KLOR-CON M) CR tablet 40 mEq (40 mEq Oral Given 05/12/23 1825)    ED Course/ Medical Decision Making/ A&P   {   Click here for ABCD2, HEART and other calculatorsREFRESH Note before signing :1}                          Medical Decision Making Amount and/or Complexity of Data Reviewed Labs: ordered.  Risk Prescription drug management.   Iv ns. Continuous pulse ox and cardiac monitoring. Labs ordered/sent.   Differential diagnosis includes drug overdose, hypoglycemia, etc. Dispo decision including potential need for admission considered - will get labs and reassess.   Reviewed nursing notes and prior charts for additional history. External reports reviewed. Additional history from: EMS.   Cardiac monitor: sinus rhythm, rate 66.  Labs reviewed/interpreted by me - wbc and hgb normal. K mildly low. Kcl po. Possible uti, rocephin iv.   Po fluids/food. Ambulate in hall.   Recheck, no resp depression. More awake/alert.   Pt is eating/drinking, no resp depression. Pt appears  medically stable/clear for bh eval.  Pt denies intentionally overdosing or trying to harm/kill self, but does endorse being overwhelmed, stressed and depressed, related to ongoing divorce, daughter moving far away from home, and 'lots of stuff'.   BH team consulted. Disposition per [facility] team.   The patient has been placed in psychiatric observation due to the need to provide a safe environment for the patient while obtaining psychiatric consultation and evaluation, as well as ongoing medical and medication management to treat the patient's condition.  med rec ordered.     {Document critical care time when appropriate:1} {Document review of labs and clinical decision tools ie heart score, Chads2Vasc2 etc:1}  {Document your independent review of radiology images, and any outside records:1} {Document your discussion with family members, caretakers, and with consultants:1} {Document social determinants of health affecting pt's care:1} {Document your decision making why or why not admission, treatments were needed:1} Final Clinical Impression(s) / ED Diagnoses Final diagnoses:  None    Rx / DC Orders ED Discharge Orders     None

## 2023-05-12 NOTE — ED Triage Notes (Signed)
BIB EMS from [place] parking lot found unresponsive by bystanders. Fire department arrived and was bagged by them to support respirations. Pt had 4mg  of Narcan and began having spontaneous respirations, but is still lethargic.  Variety of pills found on pt, pt appears to have snorted a "blue pill".  On arrival, pt is responsive to verbal stimuli.

## 2023-05-12 NOTE — ED Notes (Signed)
Second attempt at calling pt significant other. Still no answer.

## 2023-05-12 NOTE — ED Notes (Signed)
Ambulated pt in the hallway, pt was stable. No indication of SHOB, weakness, or light-headedness

## 2023-05-12 NOTE — ED Notes (Addendum)
Pt requests for this RN to call Adela Glimpse to come to ER to see her. No answer from [place], voicemail left

## 2023-11-30 ENCOUNTER — Encounter: Payer: Self-pay | Admitting: Student

## 2024-01-05 LAB — EXTERNAL GENERIC LAB PROCEDURE

## 2024-01-05 LAB — COLOGUARD

## 2024-02-14 LAB — EXTERNAL GENERIC LAB PROCEDURE: COLOGUARD: NEGATIVE

## 2024-02-14 LAB — COLOGUARD: COLOGUARD: NEGATIVE

## 2024-03-21 ENCOUNTER — Ambulatory Visit (HOSPITAL_COMMUNITY)
Admission: EM | Admit: 2024-03-21 | Discharge: 2024-03-21 | Disposition: A | Discharge status: Home / Self Care | Attending: Psychiatry | Admitting: Psychiatry

## 2024-03-21 DIAGNOSIS — Z79899 Other long term (current) drug therapy: Secondary | ICD-10-CM | POA: Insufficient documentation

## 2024-03-21 DIAGNOSIS — F411 Generalized anxiety disorder: Secondary | ICD-10-CM | POA: Insufficient documentation

## 2024-03-21 DIAGNOSIS — F32A Depression, unspecified: Secondary | ICD-10-CM | POA: Insufficient documentation

## 2024-03-21 DIAGNOSIS — Z760 Encounter for issue of repeat prescription: Secondary | ICD-10-CM | POA: Insufficient documentation

## 2024-03-21 NOTE — Discharge Instructions (Signed)
 F/u with outpatient provider for medication increase

## 2024-03-21 NOTE — ED Provider Notes (Signed)
 [facility] Medical Screening Exam  Patient Name: Jessica Gordon MRN: [id number] Date of Evaluation: 03/22/24 Chief Complaint:  medication refil Diagnosis:  Final diagnoses:  Encounter for medication refill  Generalized anxiety disorder    History of Present illness: Jessica Gordon is a 48 y.o. female. With a history of accidental overdose, adjustment disorder, general anxiety and depression.  Presented to [facility] voluntarily.  Per the patient she is trying to get a refill on her medicines when asked who is her provider patient stated Dr. Evelene Croon MD.  Patient went off in a tantrum stating that the doctor is not giving her the right amount of medicine.  When asked what medicine she tried again patient says alprazolam.  I review of [place] PDMP show that patient just got a prescription on 03/09/2024 for 2 mg alprazolam x 30 days.  Patient does seem to be medication seeking.  Writer discussed with patient that at this time I will not be able to give her any medication she need to reach back out to her psychiatrist Dr. Lafayette Dragon.  Patient is accompanied by a gentleman who she stated was somewhat if she is currently taking care off but then patient story was incongruent.   Face-to-face observation of patient, patient is alert and oriented x 4, speech is clear, maintained minimal eye contact.  Patient denies SI, HI, AVH or paranoia.  Patient denies alcohol use, denies illicit drug use.  Patient does not seem to be influenced by internal stimuli.  Does not seem to be a risk to herself or others.  Patient however seems to be medications shopping for benzodiazepine.  Writer discussed with patient that she need to reach out to her current psychiatric provider which is Dr. Evelene Croon MD.  At the time of this evaluation patient does not pose a risk to herself or others Clinical research associate discussed with patient that she will need to call 911 or return to the nearest ED should she experience any suicidal ideation  homicidal thoughts or hallucination.  Recommend discharge for patient to follow up with her provider.    Psychiatric Specialty Exam  Presentation  General Appearance:Appropriate for Environment  Eye Contact:Fair  Speech:Clear and Coherent  Speech Volume:Normal  Handedness:Right   Mood and Affect  Mood: Anxious  Affect: Appropriate   Thought Process  Thought Processes: Coherent  Descriptions of Associations:Intact  Orientation:Full (Time, Place and Person)  Thought Content:Logical    Hallucinations:None  Ideas of Reference:None  Suicidal Thoughts:No  Homicidal Thoughts:No   Sensorium  Memory: Immediate Fair  Judgment: Fair  Insight: Fair   Art therapist  Concentration: Fair  Attention Span: Fair  Recall: Good  Fund of Knowledge: Good  Language: Good   Psychomotor Activity  Psychomotor Activity: Normal   Assets  Assets: Desire for Improvement; Vocational/Educational   Sleep  Sleep: Fair  Number of hours:  5   Physical Exam: Physical Exam HENT:     Head: Normocephalic.     Nose: Nose normal.  Eyes:     Pupils: Pupils are equal, round, and reactive to light.  Cardiovascular:     Rate and Rhythm: Normal rate.  Pulmonary:     Effort: Pulmonary effort is normal.  Musculoskeletal:        General: Normal range of motion.     Cervical back: Normal range of motion.  Neurological:     General: No focal deficit present.     Mental Status: She is alert.  Psychiatric:  Mood and Affect: Mood normal.        Thought Content: Thought content normal.    Review of Systems  Constitutional: Negative.   HENT: Negative.    Eyes: Negative.   Respiratory: Negative.    Cardiovascular: Negative.   Gastrointestinal: Negative.   Genitourinary: Negative.   Musculoskeletal: Negative.   Skin: Negative.   Neurological: Negative.   Psychiatric/Behavioral:  The patient is nervous/anxious and has insomnia.    There  were no vitals taken for this visit. There is no height or weight on file to calculate BMI.  Musculoskeletal: Strength & Muscle Tone: within normal limits Gait & Station: normal Patient leans: N/A   [facility] MSE Discharge Disposition for Follow up and Recommendations: Based on my evaluation the patient does not appear to have an emergency medical condition and can be discharged with resources and follow up care in outpatient services for Medication Management   Sindy Guadeloupe, NP 03/22/2024, 5:17 AM

## 2024-03-21 NOTE — ED Notes (Signed)
 Patient was provided AVS and discharge instructions were given. Patient was walked to locker where security assisted her with her belongings.

## 2024-03-22 ENCOUNTER — Telehealth (HOSPITAL_COMMUNITY): Payer: Self-pay | Admitting: Professional

## 2024-08-24 ENCOUNTER — Other Ambulatory Visit: Payer: Self-pay

## 2024-08-24 DIAGNOSIS — Z1231 Encounter for screening mammogram for malignant neoplasm of breast: Secondary | ICD-10-CM

## 2024-09-09 ENCOUNTER — Ambulatory Visit
Admission: RE | Admit: 2024-09-09 | Discharge: 2024-09-09 | Disposition: A | Discharge status: Home / Self Care | Source: Ambulatory Visit

## 2024-09-09 DIAGNOSIS — Z1231 Encounter for screening mammogram for malignant neoplasm of breast: Secondary | ICD-10-CM

## 2025-01-10 ENCOUNTER — Other Ambulatory Visit (HOSPITAL_COMMUNITY): Payer: Self-pay
